# Patient Record
Sex: Male | Born: 1980 | Race: Asian | Hispanic: No | Marital: Married | Smoking: Never smoker
Health system: Southern US, Community
[De-identification: ages and names within clinical notes are randomized; demographics above are authoritative.]

## PROBLEM LIST (undated history)

## (undated) DIAGNOSIS — Z789 Other specified health status: Secondary | ICD-10-CM

---

## 2013-07-28 ENCOUNTER — Emergency Department: Payer: Self-pay | Admitting: Emergency Medicine

## 2014-10-21 ENCOUNTER — Emergency Department
Admission: EM | Admit: 2014-10-21 | Discharge: 2014-10-21 | Disposition: A | Discharge status: Home / Self Care | Payer: 59 | Attending: Emergency Medicine | Admitting: Emergency Medicine

## 2014-10-21 ENCOUNTER — Encounter: Payer: Self-pay | Admitting: *Deleted

## 2014-10-21 DIAGNOSIS — Y998 Other external cause status: Secondary | ICD-10-CM | POA: Insufficient documentation

## 2014-10-21 DIAGNOSIS — S30861A Insect bite (nonvenomous) of abdominal wall, initial encounter: Secondary | ICD-10-CM | POA: Diagnosis not present

## 2014-10-21 DIAGNOSIS — Y9389 Activity, other specified: Secondary | ICD-10-CM | POA: Insufficient documentation

## 2014-10-21 DIAGNOSIS — W57XXXA Bitten or stung by nonvenomous insect and other nonvenomous arthropods, initial encounter: Secondary | ICD-10-CM | POA: Diagnosis not present

## 2014-10-21 DIAGNOSIS — Y9289 Other specified places as the place of occurrence of the external cause: Secondary | ICD-10-CM | POA: Insufficient documentation

## 2014-10-21 DIAGNOSIS — R21 Rash and other nonspecific skin eruption: Secondary | ICD-10-CM | POA: Diagnosis present

## 2014-10-21 MED ORDER — DOXYCYCLINE HYCLATE 100 MG PO TABS
100.0000 mg | ORAL_TABLET | Freq: Once | ORAL | Status: AC
  Administered 2014-10-21: 100 mg via ORAL
  Filled 2014-10-21: qty 1

## 2014-10-21 MED ORDER — DOXYCYCLINE MONOHYDRATE 100 MG PO TABS: 100.0000 mg | ORAL_TABLET | Freq: Two times a day (BID) | ORAL | Status: DC

## 2014-10-21 NOTE — ED Notes (Signed)
Pt discharged home after verbalizing understanding of discharge instructions; nad noted. 

## 2014-10-21 NOTE — ED Notes (Signed)
Pt noticed a rash on his upper right abdomen this afternoon, c/o itching in the area.

## 2014-10-21 NOTE — Discharge Instructions (Signed)
Tick Bite Information Ticks are insects that attach themselves to the skin and draw blood for food. There are various types of ticks. Common types include wood ticks and deer ticks. Most ticks live in shrubs and grassy areas. Ticks can climb onto your body when you make contact with leaves or grass where the tick is waiting. The most common places on the body for ticks to attach themselves are the scalp, neck, armpits, waist, and groin. Most tick bites are harmless, but sometimes ticks carry germs that cause diseases. These germs can be spread to a person during the tick's feeding process. The chance of a disease spreading through a tick bite depends on:   The type of tick.  Time of year.   How long the tick is attached.   Geographic location.  HOW CAN YOU PREVENT TICK BITES? Take these steps to help prevent tick bites when you are outdoors:  Wear protective clothing. Long sleeves and long pants are best.   Wear white clothes so you can see ticks more easily.  Tuck your pant legs into your socks.   If walking on a trail, stay in the middle of the trail to avoid brushing against bushes.  Avoid walking through areas with long grass.  Put insect repellent on all exposed skin and along boot tops, pant legs, and sleeve cuffs.   Check clothing, hair, and skin repeatedly and before going inside.   Brush off any ticks that are not attached.  Take a shower or bath as soon as possible after being outdoors.  WHAT IS THE PROPER WAY TO REMOVE A TICK? Ticks should be removed as soon as possible to help prevent diseases caused by tick bites. 1. If latex gloves are available, put them on before trying to remove a tick.  2. Using fine-point tweezers, grasp the tick as close to the skin as possible. You may also use curved forceps or a tick removal tool. Grasp the tick as close to its head as possible. Avoid grasping the tick on its body. 3. Pull gently with steady upward pressure until  the tick lets go. Do not twist the tick or jerk it suddenly. This may break off the tick's head or mouth parts. 4. Do not squeeze or crush the tick's body. This could force disease-carrying fluids from the tick into your body.  5. After the tick is removed, wash the bite area and your hands with soap and water or other disinfectant such as alcohol. 6. Apply a small amount of antiseptic cream or ointment to the bite site.  7. Wash and disinfect any instruments that were used.  Do not try to remove a tick by applying a hot match, petroleum jelly, or fingernail polish to the tick. These methods do not work and may increase the chances of disease being spread from the tick bite.  WHEN SHOULD YOU SEEK MEDICAL CARE? Contact your health care provider if you are unable to remove a tick from your skin or if a part of the tick breaks off and is stuck in the skin.  After a tick bite, you need to be aware of signs and symptoms that could be related to diseases spread by ticks. Contact your health care provider if you develop any of the following in the days or weeks after the tick bite:  Unexplained fever.  Rash. A circular rash that appears days or weeks after the tick bite may indicate the possibility of Lyme disease. The rash may resemble  a target with a bull's-eye and may occur at a different part of your body than the tick bite.  Redness and swelling in the area of the tick bite.   Tender, swollen lymph glands.   Diarrhea.   Weight loss.   Cough.   Fatigue.   Muscle, joint, or bone pain.   Abdominal pain.   Headache.   Lethargy or a change in your level of consciousness.  Difficulty walking or moving your legs.   Numbness in the legs.   Paralysis.  Shortness of breath.   Confusion.   Repeated vomiting.  Document Released: 03/23/2000 Document Revised: 01/14/2013 Document Reviewed: 09/03/2012 [facility] Patient Information 2015 [place], [place]. This information is  not intended to replace advice given to you by your health care provider. Make sure you discuss any questions you have with your health care provider.   Take antibiotics as directed and follow up with your physician for any concerns.

## 2014-10-21 NOTE — ED Provider Notes (Signed)
[facility] Emergency Department Provider Note  ____________________________________________  Time seen: Approximately 11:15 PM  I have reviewed the triage vital signs and the nursing notes.   HISTORY  Chief Complaint Rash    HPI Jewell Ryans is a 34 y.o. male who noticed a rash to the right upper abdomen today. Some itching. It has a bull's-eye appearance. He did not see a tick today but remembers seeing one last week. No fevers chills, nausea, headache or myalgias. Otherwise he is doing fine.   History reviewed. No pertinent past medical history.  There are no active problems to display for this patient.   History reviewed. No pertinent past surgical history.  Current Outpatient Rx  Name  Route  Sig  Dispense  Refill  . doxycycline (ADOXA) 100 MG tablet   Oral   Take 1 tablet (100 mg total) by mouth 2 (two) times daily.   20 tablet   1     Allergies Review of patient's allergies indicates no known allergies.  No family history on file.  Social History History  Substance Use Topics  . Smoking status: Never Smoker   . Smokeless tobacco: Not on file  . Alcohol Use: No    Review of Systems Constitutional: No fever/chills Eyes: No visual changes. ENT: No sore throat. Cardiovascular: Denies chest pain. Respiratory: Denies shortness of breath. Gastrointestinal: No abdominal pain.  No nausea, no vomiting.  No diarrhea.  No constipation. Musculoskeletal: Negative for back pain. Skin: see above Neurological: Negative for headaches, focal weakness or numbness.  10-point ROS otherwise negative.  ____________________________________________   PHYSICAL EXAM:  VITAL SIGNS: ED Triage Vitals  Enc Vitals Group     BP 10/21/14 2128 141/109 mmHg     Pulse Rate 10/21/14 2128 62     Resp 10/21/14 2128 18     Temp 10/21/14 2128 97.4 F (36.3 C)     Temp Source 10/21/14 2128 Oral     SpO2 10/21/14 2128 96 %     Weight 10/21/14 2128 215  lb (97.523 kg)     Height 10/21/14 2128  (1.778 m)     Head Cir --      Peak Flow --      Pain Score 10/21/14 2126 5     Pain Loc --      Pain Edu? --      Excl. in GC? --     Constitutional: Alert and oriented. Well appearing and in no acute distress. Eyes: Conjunctivae are normal. PERRL. EOMI. Head: Atraumatic. Nose: No congestion/rhinnorhea. Mouth/Throat: Mucous membranes are moist.  Oropharynx non-erythematous. Neck: supple Cardiovascular: Normal rate, regular rhythm. Grossly normal heart sounds.  Good peripheral circulation. Respiratory: Normal respiratory effort.  No retractions. Lungs CTAB. Skin:  Skin is warm, dry and intact. Bull's-eye rash to the right upper abdomen. Red circumference with central clearing and central punctate lesion. Psychiatric: Mood and affect are normal. Speech and behavior are normal.  ____________________________________________   LABS (all labs ordered are listed, but only abnormal results are displayed)  Labs Reviewed - No data to display ____________________________________________  EKG   ____________________________________________  RADIOLOGY   ____________________________________________   PROCEDURES  Procedure(s) performed: None  Critical Care performed: No  ____________________________________________   INITIAL IMPRESSION / ASSESSMENT AND PLAN / ED COURSE  Pertinent labs & imaging results that were available during my care of the patient were reviewed by me and considered in my medical decision making (see chart for details).  34 year old male with  a poles eye rash to the right upper abdomen and possible tick exposure. Otherwise asymptomatic. He is given doxycycline in the ER and a prescription to cover for Lyme disease. He will follow-up with his physician if he develops any symptoms. I also rechecked his blood pressure in the exam room, and it was 134/84. ____________________________________________   FINAL  CLINICAL IMPRESSION(S) / ED DIAGNOSES  Final diagnoses:  Tick bite      Ignacia BayleyRobert Chastidy Ranker, PA-C 10/21/14 2319  Myrna Blazeravid Matthew Schaevitz, MD 10/21/14 [phone]

## 2015-01-15 ENCOUNTER — Encounter: Payer: Self-pay | Admitting: *Deleted

## 2015-01-15 ENCOUNTER — Emergency Department
Admission: EM | Admit: 2015-01-15 | Discharge: 2015-01-15 | Disposition: A | Discharge status: Home / Self Care | Payer: 59 | Attending: Emergency Medicine | Admitting: Emergency Medicine

## 2015-01-15 DIAGNOSIS — K047 Periapical abscess without sinus: Secondary | ICD-10-CM | POA: Insufficient documentation

## 2015-01-15 DIAGNOSIS — K029 Dental caries, unspecified: Secondary | ICD-10-CM | POA: Insufficient documentation

## 2015-01-15 DIAGNOSIS — Z792 Long term (current) use of antibiotics: Secondary | ICD-10-CM | POA: Diagnosis not present

## 2015-01-15 DIAGNOSIS — K0889 Other specified disorders of teeth and supporting structures: Secondary | ICD-10-CM | POA: Diagnosis present

## 2015-01-15 MED ORDER — LIDOCAINE-EPINEPHRINE 2 %-1:100000 IJ SOLN
1.7000 mL | Freq: Once | INTRAMUSCULAR | Status: AC
  Administered 2015-01-15: 1.7 mL
  Filled 2015-01-15: qty 1.7

## 2015-01-15 MED ORDER — IBUPROFEN 800 MG PO TABS: 800.0000 mg | ORAL_TABLET | Freq: Three times a day (TID) | ORAL | Status: DC | PRN

## 2015-01-15 MED ORDER — TRAMADOL HCL 50 MG PO TABS: 50.0000 mg | ORAL_TABLET | Freq: Four times a day (QID) | ORAL | Status: DC | PRN

## 2015-01-15 MED ORDER — PENICILLIN V POTASSIUM 500 MG PO TABS: 500.0000 mg | ORAL_TABLET | Freq: Four times a day (QID) | ORAL | Status: DC

## 2015-01-15 NOTE — Discharge Instructions (Signed)

## 2015-01-15 NOTE — ED Notes (Signed)
Pt states abscess on left side of cheek inside mouth, states swelling and pain

## 2015-01-15 NOTE — ED Provider Notes (Signed)
CSN: [id number]     Arrival date & time 01/15/15  1825 History   First MD Initiated Contact with Patient 01/15/15 1948     Chief Complaint  Patient presents with  . Abscess  . Dental Pain     (Consider location/radiation/quality/duration/timing/severity/associated sxs/prior Treatment) HPI  34 year old male with dental pain for 7 days. He has a history of fractured tooth in the left upper jaw. Patient states he's had increased pain and swelling over the last 7 days. Pain is 10 out of 10 today. No drainage, warmth, erythema. No fevers. Pain is increased with eating. No relief with ibuprofen.  History reviewed. No pertinent past medical history. History reviewed. No pertinent past surgical history. History reviewed. No pertinent family history. Social History  Substance Use Topics  . Smoking status: Never Smoker   . Smokeless tobacco: None  . Alcohol Use: No    Review of Systems  Constitutional: Negative.  Negative for fever and chills.  HENT: Positive for dental problem and facial swelling. Negative for drooling, mouth sores, trouble swallowing and voice change.   Respiratory: Negative for chest tightness and shortness of breath.   Cardiovascular: Negative for chest pain.  Gastrointestinal: Negative for nausea, vomiting, abdominal pain and diarrhea.  Musculoskeletal: Negative for arthralgias, neck pain and neck stiffness.  Skin: Negative.   Psychiatric/Behavioral: Negative for confusion.  All other systems reviewed and are negative.     Allergies  Review of patient's allergies indicates no known allergies.  Home Medications   Prior to Admission medications   Medication Sig Start Date End Date Taking? Authorizing Provider  doxycycline (ADOXA) 100 MG tablet Take 1 tablet (100 mg total) by mouth 2 (two) times daily. 10/21/14   Ignacia Bayley, PA-C  ibuprofen (ADVIL,MOTRIN) 800 MG tablet Take 1 tablet (800 mg total) by mouth every 8 (eight) hours as needed. 01/15/15   Evon Slack, PA-C  penicillin v potassium (VEETID) 500 MG tablet Take 1 tablet (500 mg total) by mouth 4 (four) times daily. 01/15/15   Evon Slack, PA-C  traMADol (ULTRAM) 50 MG tablet Take 1 tablet (50 mg total) by mouth every 6 (six) hours as needed. 01/15/15   Evon Slack, PA-C   BP 151/100 mmHg  Pulse 77  Temp(Src) 98.5 F (36.9 C) (Oral)  Resp 18  Ht  (1.778 m)  Wt 215 lb (97.523 kg)  BMI 30.85 kg/m2  SpO2 97% Physical Exam  Constitutional: He is oriented to person, place, and time. He appears well-developed and well-nourished. No distress.  HENT:  Head: Normocephalic and atraumatic.  Right Ear: External ear normal.  Left Ear: External ear normal.  Nose: Nose normal.  Mouth/Throat: Uvula is midline and oropharynx is clear and moist. No oral lesions. No trismus in the jaw. Normal dentition. Dental abscesses and dental caries present. No uvula swelling.    Eyes: Conjunctivae and EOM are normal. Pupils are equal, round, and reactive to light.  Neck: Normal range of motion. Neck supple.  Cardiovascular: Normal rate and intact distal pulses.   Pulmonary/Chest: Effort normal. No respiratory distress.  Musculoskeletal: Normal range of motion. He exhibits no edema or tenderness.  Neurological: He is alert and oriented to person, place, and time.  Skin: Skin is warm and dry.  Psychiatric: He has a normal mood and affect. His behavior is normal. Judgment and thought content normal.    ED Course  Procedures (including critical care time) Dental block: 1.3 cc of 1% lidocaine with epinephrine was injected  just above the gumline of tooth #15. She tolerated the procedure well. Significant relief after injection.  Labs Review Labs Reviewed - No data to display  Imaging Review No results found. I have personally reviewed and evaluated these images and lab results as part of my medical decision-making.   EKG Interpretation None      MDM   Final diagnoses:  Dental  abscess  Pain, dental   34 year old male with dental abscess/pain. Dental block given in the emergency department. Patient tolerated this well. Pain significantly improved. Started on penicillin VK, given ibuprofen and tramadol. Follow-up with [facility].    Evon Slack, PA-C 01/15/15 1956  Rockne Menghini, MD 01/15/15 2219

## 2019-06-25 ENCOUNTER — Ambulatory Visit: Payer: Self-pay | Attending: Internal Medicine

## 2019-06-25 DIAGNOSIS — Z20822 Contact with and (suspected) exposure to covid-19: Secondary | ICD-10-CM

## 2019-06-27 LAB — NOVEL CORONAVIRUS, NAA: SARS-CoV-2, NAA: NOT DETECTED

## 2020-01-29 ENCOUNTER — Other Ambulatory Visit: Payer: Self-pay

## 2020-01-29 ENCOUNTER — Emergency Department: Payer: Medicaid Other

## 2020-01-29 ENCOUNTER — Inpatient Hospital Stay
Admission: EM | Admit: 2020-01-29 | Discharge: 2020-02-03 | DRG: 871 | Disposition: A | Discharge status: Home / Self Care | Payer: Medicaid Other | Attending: Internal Medicine | Admitting: Internal Medicine

## 2020-01-29 DIAGNOSIS — U071 COVID-19: Secondary | ICD-10-CM

## 2020-01-29 DIAGNOSIS — Z79899 Other long term (current) drug therapy: Secondary | ICD-10-CM | POA: Diagnosis not present

## 2020-01-29 DIAGNOSIS — J9601 Acute respiratory failure with hypoxia: Secondary | ICD-10-CM | POA: Diagnosis present

## 2020-01-29 DIAGNOSIS — E669 Obesity, unspecified: Secondary | ICD-10-CM | POA: Diagnosis present

## 2020-01-29 DIAGNOSIS — D696 Thrombocytopenia, unspecified: Secondary | ICD-10-CM | POA: Diagnosis present

## 2020-01-29 DIAGNOSIS — Z6830 Body mass index (BMI) 30.0-30.9, adult: Secondary | ICD-10-CM | POA: Diagnosis not present

## 2020-01-29 DIAGNOSIS — I10 Essential (primary) hypertension: Secondary | ICD-10-CM | POA: Diagnosis present

## 2020-01-29 DIAGNOSIS — J1282 Pneumonia due to coronavirus disease 2019: Secondary | ICD-10-CM | POA: Diagnosis present

## 2020-01-29 DIAGNOSIS — A4189 Other specified sepsis: Secondary | ICD-10-CM | POA: Diagnosis present

## 2020-01-29 DIAGNOSIS — A0839 Other viral enteritis: Secondary | ICD-10-CM | POA: Diagnosis present

## 2020-01-29 DIAGNOSIS — R109 Unspecified abdominal pain: Secondary | ICD-10-CM

## 2020-01-29 HISTORY — DX: Other specified health status: Z78.9

## 2020-01-29 LAB — CBC WITH DIFFERENTIAL/PLATELET
Abs Immature Granulocytes: 0.03 10*3/uL (ref 0.00–0.07)
Basophils Absolute: 0 10*3/uL (ref 0.0–0.1)
Basophils Relative: 0 %
Eosinophils Absolute: 0 10*3/uL (ref 0.0–0.5)
Eosinophils Relative: 0 %
HCT: 47.4 % (ref 39.0–52.0)
Hemoglobin: 15.9 g/dL (ref 13.0–17.0)
Immature Granulocytes: 1 %
Lymphocytes Relative: 15 %
Lymphs Abs: 0.9 10*3/uL (ref 0.7–4.0)
MCH: 27.6 pg (ref 26.0–34.0)
MCHC: 33.5 g/dL (ref 30.0–36.0)
MCV: 82.3 fL (ref 80.0–100.0)
Monocytes Absolute: 0.2 10*3/uL (ref 0.1–1.0)
Monocytes Relative: 3 %
Neutro Abs: 4.7 10*3/uL (ref 1.7–7.7)
Neutrophils Relative %: 81 %
Platelets: 142 10*3/uL — ABNORMAL LOW (ref 150–400)
RBC: 5.76 MIL/uL (ref 4.22–5.81)
RDW: 14.2 % (ref 11.5–15.5)
WBC: 5.7 10*3/uL (ref 4.0–10.5)
nRBC: 0 % (ref 0.0–0.2)

## 2020-01-29 LAB — URINALYSIS, COMPLETE (UACMP) WITH MICROSCOPIC
Bilirubin Urine: NEGATIVE
Glucose, UA: NEGATIVE mg/dL
Hgb urine dipstick: NEGATIVE
Ketones, ur: NEGATIVE mg/dL
Leukocytes,Ua: NEGATIVE
Nitrite: NEGATIVE
Protein, ur: 100 mg/dL — AB
Specific Gravity, Urine: 1.035 — ABNORMAL HIGH (ref 1.005–1.030)
Squamous Epithelial / LPF: NONE SEEN (ref 0–5)
pH: 5 (ref 5.0–8.0)

## 2020-01-29 LAB — COMPREHENSIVE METABOLIC PANEL
ALT: 75 U/L — ABNORMAL HIGH (ref 0–44)
AST: 68 U/L — ABNORMAL HIGH (ref 15–41)
Albumin: 4 g/dL (ref 3.5–5.0)
Alkaline Phosphatase: 74 U/L (ref 38–126)
Anion gap: 12 (ref 5–15)
BUN: 14 mg/dL (ref 6–20)
CO2: 24 mmol/L (ref 22–32)
Calcium: 8.7 mg/dL — ABNORMAL LOW (ref 8.9–10.3)
Chloride: 101 mmol/L (ref 98–111)
Creatinine, Ser: 1.22 mg/dL (ref 0.61–1.24)
GFR, Estimated: >60 mL/min (ref >60)
Glucose, Bld: 114 mg/dL — ABNORMAL HIGH (ref 70–99)
Potassium: 4 mmol/L (ref 3.5–5.1)
Sodium: 137 mmol/L (ref 135–145)
Total Bilirubin: 0.7 mg/dL (ref 0.3–1.2)
Total Protein: 7.8 g/dL (ref 6.5–8.1)

## 2020-01-29 LAB — FIBRIN DERIVATIVES D-DIMER (ARMC ONLY): Fibrin derivatives D-dimer (ARMC): 540.16 ng/mL (FEU) — ABNORMAL HIGH (ref 0.00–499.00)

## 2020-01-29 LAB — TROPONIN I (HIGH SENSITIVITY)
Troponin I (High Sensitivity): 14 ng/L (ref <18)
Troponin I (High Sensitivity): 17 ng/L (ref <18)

## 2020-01-29 LAB — PROCALCITONIN: Procalcitonin: 0.15 ng/mL

## 2020-01-29 LAB — LACTIC ACID, PLASMA
Lactic Acid, Venous: 1 mmol/L (ref 0.5–1.9)
Lactic Acid, Venous: 1.6 mmol/L (ref 0.5–1.9)

## 2020-01-29 MED ORDER — SODIUM CHLORIDE 0.9 % IV SOLN: 100.0000 mg | Freq: Every day | INTRAVENOUS | Status: DC

## 2020-01-29 MED ORDER — DEXAMETHASONE SODIUM PHOSPHATE 10 MG/ML IJ SOLN
6.0000 mg | INTRAMUSCULAR | Status: DC
  Administered 2020-01-29: 6 mg via INTRAVENOUS
  Filled 2020-01-29: qty 1

## 2020-01-29 MED ORDER — HYDROCOD POLST-CPM POLST ER 10-8 MG/5ML PO SUER
5.0000 mL | Freq: Two times a day (BID) | ORAL | Status: DC | PRN
  Administered 2020-01-30: 5 mL via ORAL
  Filled 2020-01-29: qty 5

## 2020-01-29 MED ORDER — ONDANSETRON HCL 4 MG PO TABS
4.0000 mg | ORAL_TABLET | Freq: Four times a day (QID) | ORAL | Status: DC | PRN
  Administered 2020-01-30: 14:00:00 4 mg via ORAL
  Filled 2020-01-29: qty 1

## 2020-01-29 MED ORDER — SODIUM CHLORIDE 0.9 % IV BOLUS
1000.0000 mL | Freq: Once | INTRAVENOUS | Status: AC
Start: 2020-01-29 — End: 2020-01-29
  Administered 2020-01-29: 1000 mL via INTRAVENOUS

## 2020-01-29 MED ORDER — ASCORBIC ACID 500 MG PO TABS
500.0000 mg | ORAL_TABLET | Freq: Every day | ORAL | Status: DC
  Administered 2020-01-30 – 2020-02-03 (×5): 500 mg via ORAL
  Filled 2020-01-29 (×5): qty 1

## 2020-01-29 MED ORDER — IPRATROPIUM-ALBUTEROL 20-100 MCG/ACT IN AERS
1.0000 | INHALATION_SPRAY | Freq: Four times a day (QID) | RESPIRATORY_TRACT | Status: DC
  Administered 2020-01-29 – 2020-02-03 (×20): 1 via RESPIRATORY_TRACT
  Filled 2020-01-29 (×2): qty 4

## 2020-01-29 MED ORDER — ENOXAPARIN SODIUM 40 MG/0.4ML ~~LOC~~ SOLN
40.0000 mg | SUBCUTANEOUS | Status: DC
  Administered 2020-01-29 – 2020-01-30 (×2): 40 mg via SUBCUTANEOUS
  Filled 2020-01-29 (×2): qty 0.4

## 2020-01-29 MED ORDER — BARICITINIB 2 MG PO TABS
4.0000 mg | ORAL_TABLET | Freq: Every day | ORAL | Status: DC
  Administered 2020-01-30 – 2020-02-03 (×5): 4 mg via ORAL
  Filled 2020-01-29 (×5): qty 2

## 2020-01-29 MED ORDER — GUAIFENESIN-DM 100-10 MG/5ML PO SYRP
10.0000 mL | ORAL_SOLUTION | ORAL | Status: DC | PRN
  Filled 2020-01-29: qty 10

## 2020-01-29 MED ORDER — ACETAMINOPHEN 500 MG PO TABS
1000.0000 mg | ORAL_TABLET | Freq: Once | ORAL | Status: AC
  Administered 2020-01-29: 1000 mg via ORAL
  Filled 2020-01-29: qty 2

## 2020-01-29 MED ORDER — ONDANSETRON HCL 4 MG/2ML IJ SOLN: 4.0000 mg | Freq: Four times a day (QID) | INTRAMUSCULAR | Status: DC | PRN

## 2020-01-29 MED ORDER — SODIUM CHLORIDE 0.9 % IV SOLN
200.0000 mg | Freq: Once | INTRAVENOUS | Status: AC
  Administered 2020-01-29: 200 mg via INTRAVENOUS
  Filled 2020-01-29: qty 200

## 2020-01-29 MED ORDER — METHYLPREDNISOLONE SODIUM SUCC 125 MG IJ SOLR
100.0000 mg | Freq: Once | INTRAMUSCULAR | Status: AC
  Administered 2020-01-29: 100 mg via INTRAVENOUS
  Filled 2020-01-29: qty 2

## 2020-01-29 MED ORDER — SODIUM CHLORIDE 0.9 % IV SOLN
100.0000 mg | Freq: Every day | INTRAVENOUS | Status: AC
  Administered 2020-01-30 – 2020-02-02 (×4): 100 mg via INTRAVENOUS
  Filled 2020-01-29 (×4): qty 20

## 2020-01-29 MED ORDER — SODIUM CHLORIDE 0.9 % IV SOLN: 200.0000 mg | Freq: Once | INTRAVENOUS | Status: DC

## 2020-01-29 MED ORDER — ZINC SULFATE 220 (50 ZN) MG PO CAPS
220.0000 mg | ORAL_CAPSULE | Freq: Every day | ORAL | Status: DC
  Administered 2020-01-30 – 2020-02-03 (×5): 220 mg via ORAL
  Filled 2020-01-29 (×5): qty 1

## 2020-01-29 MED ORDER — ACETAMINOPHEN 325 MG PO TABS
650.0000 mg | ORAL_TABLET | Freq: Four times a day (QID) | ORAL | Status: DC | PRN
  Administered 2020-01-30 – 2020-02-02 (×4): 650 mg via ORAL
  Filled 2020-01-29 (×4): qty 2

## 2020-01-29 NOTE — H&P (Signed)
History and Physical   Sherril Heyward JOI:[id number] DOB: [birth year] DOA: 01/29/2020  Referring MD/NP/PA: Dr. Shaune Pollack  PCP: [facility]   Outpatient Specialists: None  Patient coming from: Home  Chief Complaint: Fever and fatigue  HPI: Lawrence Wolf is a 39 y.o. male with medical history significant of no significant past medical history who was diagnosed with COVID-19 about 8 days ago.  At that point he was having fever chills cough and nausea vomiting.  He was seen in the clinic and diagnosed with COVID-19.  Patient was discharged home on supportive care.  He continues to have nausea vomiting and diarrhea.  He has lost at least 10 pounds over the.  Of time.  Patient has not gradually developed shortness of breath in the last 24 hours was extreme fatigue.  He was unable to walk.  He was also unable to function.  He has been feeling dizzy lightheaded and not able to keep food down.  He came to the ER where he was seen and evaluated.  Patient described presyncopal symptoms.  At this point he is only mildly hypoxic with movement with oxygen sats 89 to 91% range.  His x-ray showed bilateral infiltrates.  Due to progressive worsening of symptoms and the presyncopal episodes as well as nausea vomiting patient being admitted to the hospital for further evaluation.  He appears to have more GI manifestation with a Covid at this point..  ED Course: Temperature is 102.6 his Wolf pressure 120/75 pulse 101 respirate of 35 oxygen sat 92% room air.  Urinalysis essentially negative.  Fibrin 540.16 procalcitonin of 0.15 lactic acid 1.6 troponin XVII.  Chemistry showed glucose 114 calcium 8.7 AST 68 ALT 75 the rest of the chemistry appears to be within normal.  Chest x-ray showed patchy airspace opacities consistent with COVID-19 infection.  Patient is being admitted with COVID-19 pneumonia symptomatic with bilateral pneumonia.  Review of Systems: As per HPI otherwise 10 point review of systems  negative.    History reviewed. No pertinent past medical history.  History reviewed. No pertinent surgical history.   reports that he has never smoked. He does not have any smokeless tobacco history on file. He reports that he does not drink alcohol and does not use drugs.  No Known Allergies  No family history on file.   Prior to Admission medications   Medication Sig Start Date End Date Taking? Authorizing Provider  cyanocobalamin (,VITAMIN B-12,) 1000 MCG/ML injection Inject 1,000 mcg into the muscle every 30 (thirty) days. 12/21/19  Yes [provider]    Physical Exam: Vitals:   01/29/20 1814 01/29/20 2100  BP: 122/75 113/69  Pulse: (!) 101 88  Resp: (!) 26 (!) 34  Temp: (!) 102.5 F (39.2 C) (!) 102.6 F (39.2 C)  TempSrc: Oral Oral  SpO2: 95% 94%  Weight: 96.2 kg   Height: 5\' 10"  (1.778 m)       Constitutional: Acutely ill looking, no distress Vitals:   01/29/20 1814 01/29/20 2100  BP: 122/75 113/69  Pulse: (!) 101 88  Resp: (!) 26 (!) 34  Temp: (!) 102.5 F (39.2 C) (!) 102.6 F (39.2 C)  TempSrc: Oral Oral  SpO2: 95% 94%  Weight: 96.2 kg   Height: 5\' 10"  (1.778 m)    Eyes: PERRL, lids and conjunctivae normal ENMT: Mucous membranes are dry. Posterior pharynx clear of any exudate or lesions.Normal dentition.  Neck: normal, supple, no masses, no thyromegaly Respiratory: Decreased air entry bilaterally, coarse breath sound,  diffuse rhonchi bilaterally, normal respiratory effort. No accessory muscle use.  Cardiovascular: Sinus tachycardia, no murmurs / rubs / gallops. No extremity edema. 2+ pedal pulses. No carotid bruits.  Abdomen: no tenderness, no masses palpated. No hepatosplenomegaly. Bowel sounds positive.  Musculoskeletal: no clubbing / cyanosis. No joint deformity upper and lower extremities. Good ROM, no contractures. Normal muscle tone.  Skin: no rashes, lesions, ulcers. No induration Neurologic: CN 2-12 grossly intact. Sensation intact,  DTR normal. Strength 5/5 in all 4.  Psychiatric: Normal judgment and insight. Alert and oriented x 3. Normal mood.     Labs on Admission: I have personally reviewed following labs and imaging studies  CBC: Recent Labs  Lab 01/29/20 1825  WBC 5.7  NEUTROABS 4.7  HGB 15.9  HCT 47.4  MCV 82.3  PLT 142*   Basic Metabolic Panel: Recent Labs  Lab 01/29/20 1825  NA 137  K 4.0  CL 101  CO2 24  GLUCOSE 114*  BUN 14  CREATININE 1.22  CALCIUM 8.7*   GFR: Estimated Creatinine Clearance: 96.5 mL/min (by C-G formula based on SCr of 1.22 mg/dL). Liver Function Tests: Recent Labs  Lab 01/29/20 1825  AST 68*  ALT 75*  ALKPHOS 74  BILITOT 0.7  PROT 7.8  ALBUMIN 4.0   No results for input(s): LIPASE, AMYLASE in the last 168 hours. No results for input(s): AMMONIA in the last 168 hours. Coagulation Profile: No results for input(s): INR, PROTIME in the last 168 hours. Cardiac Enzymes: No results for input(s): CKTOTAL, CKMB, CKMBINDEX, TROPONINI in the last 168 hours. BNP (last 3 results) No results for input(s): PROBNP in the last 8760 hours. HbA1C: No results for input(s): HGBA1C in the last 72 hours. CBG: No results for input(s): GLUCAP in the last 168 hours. Lipid Profile: No results for input(s): CHOL, HDL, LDLCALC, TRIG, CHOLHDL, LDLDIRECT in the last 72 hours. Thyroid Function Tests: No results for input(s): TSH, T4TOTAL, FREET4, T3FREE, THYROIDAB in the last 72 hours. Anemia Panel: No results for input(s): VITAMINB12, FOLATE, FERRITIN, TIBC, IRON, RETICCTPCT in the last 72 hours. Urine analysis:    Component Value Date/Time   COLORURINE AMBER (A) 01/29/2020 2057   APPEARANCEUR HAZY (A) 01/29/2020 2057   LABSPEC 1.035 (H) 01/29/2020 2057   PHURINE 5.0 01/29/2020 2057   GLUCOSEU NEGATIVE 01/29/2020 2057   HGBUR NEGATIVE 01/29/2020 2057   BILIRUBINUR NEGATIVE 01/29/2020 2057   KETONESUR NEGATIVE 01/29/2020 2057   PROTEINUR 100 (A) 01/29/2020 2057   NITRITE  NEGATIVE 01/29/2020 2057   LEUKOCYTESUR NEGATIVE 01/29/2020 2057   Sepsis Labs: @LABRCNTIP (procalcitonin:4,lacticidven:4) )No results found for this or any previous visit (from the past 240 hour(s)).   Radiological Exams on Admission: DG Chest 2 View  Result Date: 01/29/2020 CLINICAL DATA:  Shortness of breath and COVID-19 positivity EXAM: CHEST - 2 VIEW COMPARISON:  None. FINDINGS: Cardiac shadow is within normal limits. Scattered airspace opacities are noted throughout both lungs consistent with the given clinical history. No sizable effusion or pneumothorax is noted. No bony abnormality is seen. IMPRESSION: Patchy airspace opacities consistent with the given clinical history. Electronically Signed   By: 01/31/2020 M.D.   On: 01/29/2020 18:53    EKG: Independently reviewed.  Sinus tachycardia  Assessment/Plan Active Problems:   Sepsis due to COVID-19 [facility])     #1 sepsis: Secondary to COVID-19 infection.  Patient meets sepsis criteria with temperature pulse rate as well as respiratory rate.  We will admit the patient and treat him with sepsis protocol.  Mild  hydration but try to keep him close to dryness due to Covid infection.  #2 COVID-19 pneumonia: Patient will be initiated on remdesivir, dexamethasone, oxygen, multivitamins, follow daily CRP on labs.  Also inhalers as needed.  Other supportive care.  #3 nausea vomiting diarrhea: Suspected due to COVID-19 infection.  Continue treatment as above.   DVT prophylaxis: Lovenox Code Status: Full code Family Communication: No family at bedside Disposition Plan: Home Consults called: None Admission status: Inpatient  Severity of Illness: The appropriate patient status for this patient is INPATIENT. Inpatient status is judged to be reasonable and necessary in order to provide the required intensity of service to ensure the patient's safety. The patient's presenting symptoms, physical exam findings, and initial radiographic and  laboratory data in the context of their chronic comorbidities is felt to place them at high risk for further clinical deterioration. Furthermore, it is not anticipated that the patient will be medically stable for discharge from the hospital within 2 midnights of admission. The following factors support the patient status of inpatient.   " The patient's presenting symptoms include nausea vomiting diarrhea and fatigue. " The worrisome physical exam findings include dry mucous membranes. " The initial radiographic and laboratory data are worrisome because of evidence of bilateral pneumonia. " The chronic co-morbidities include none.   * I certify that at the point of admission it is my clinical judgment that the patient will require inpatient hospital care spanning beyond 2 midnights from the point of admission due to high intensity of service, high risk for further deterioration and high frequency of surveillance required.Lawrence Blood MD [facility] Pager [phone]  If 7PM-7AM, please contact night-coverage [email or web address] Password [email or web address]  01/29/2020, 10:18 PM

## 2020-01-29 NOTE — ED Triage Notes (Signed)
Pt to ED POV tested COVID + last Friday, states he is not feeling well.  Reports intermittent CP and SHOB Diarrhea, N/V that started 2 days ago.  Febrile on arrival 102.5, states he took ibuprofen and tylenol before coming  Pt speaking in complete sentences, NAD noted

## 2020-01-29 NOTE — ED Provider Notes (Signed)
[facility] Emergency Department Provider Note  ____________________________________________   First MD Initiated Contact with Patient 01/29/20 2035     (approximate)  I have reviewed the triage vital signs and the nursing notes.   HISTORY  Chief Complaint COVID+    HPI Lawrence Wolf is a 39 y.o. male  Here with covid-19, fatigue. Pt states that approx 8 days ago, he began to develop fever, chills, cough, n/v. He went to a clinic and was diagnosed with COVID-19. Since then, he's had persistent n/v, diarrhea and >10 lb weight loss. Over the past 24 hours, he's had acute worsening in SOB along with extreme fatigue. He has had difficulty walking, eating, or moving for past 24 hours. He feels extremely lightheaded and near syncopal with standing. No h/o lung disease. No smoking. No other complaints. Sx are all worse with movement. He has been taking APAP, ibuprofen w/o significant relief.        History reviewed. No pertinent past medical history.  There are no problems to display for this patient.   History reviewed. No pertinent surgical history.  Prior to Admission medications   Medication Sig Start Date End Date Taking? Authorizing Provider  cyanocobalamin (,VITAMIN B-12,) 1000 MCG/ML injection Inject 1,000 mcg into the muscle every 30 (thirty) days. 12/21/19  Yes [provider]    Allergies Patient has no known allergies.  No family history on file.  Social History Social History   Tobacco Use  . Smoking status: Never Smoker  Substance Use Topics  . Alcohol use: No  . Drug use: No    Review of Systems  Review of Systems  Constitutional: Positive for chills and fatigue. Negative for fever.  HENT: Positive for sore throat.   Respiratory: Positive for cough and shortness of breath.   Cardiovascular: Positive for palpitations. Negative for chest pain.  Gastrointestinal: Positive for diarrhea, nausea and vomiting. Negative for  abdominal pain.  Genitourinary: Negative for flank pain.  Musculoskeletal: Negative for neck pain.  Skin: Negative for rash and wound.  Allergic/Immunologic: Negative for immunocompromised state.  Neurological: Positive for weakness. Negative for numbness.  Hematological: Does not bruise/bleed easily.  All other systems reviewed and are negative.    ____________________________________________  PHYSICAL EXAM:      VITAL SIGNS: ED Triage Vitals [01/29/20 1814]  Enc Vitals Group     BP 122/75     Pulse Rate (!) 101     Resp (!) 26     Temp (!) 102.5 F (39.2 C)     Temp Source Oral     SpO2 95 %     Weight 212 lb (96.2 kg)     Height 5\' 10"  (1.778 m)     Head Circumference      Peak Flow      Pain Score 7     Pain Loc      Pain Edu?      Excl. in GC?      Physical Exam Vitals and nursing note reviewed.  Constitutional:      General: He is not in acute distress.    Appearance: He is well-developed.  HENT:     Head: Normocephalic and atraumatic.     Mouth/Throat:     Mouth: Mucous membranes are dry.  Eyes:     Conjunctiva/sclera: Conjunctivae normal.  Cardiovascular:     Rate and Rhythm: Regular rhythm. Tachycardia present.     Heart sounds: Normal heart sounds. No murmur heard.  No  friction rub.  Pulmonary:     Effort: Tachypnea and respiratory distress present.     Breath sounds: Examination of the right-middle field reveals rales. Examination of the left-middle field reveals rales. Examination of the right-lower field reveals rales. Examination of the left-lower field reveals rales. Decreased breath sounds and rales present.  Abdominal:     General: There is no distension.     Palpations: Abdomen is soft.     Tenderness: There is no abdominal tenderness.  Musculoskeletal:     Cervical back: Neck supple.  Skin:    General: Skin is warm.     Capillary Refill: Capillary refill takes less than 2 seconds.  Neurological:     Mental Status: He is alert and  oriented to person, place, and time.     Motor: No abnormal muscle tone.       ____________________________________________   LABS (all labs ordered are listed, but only abnormal results are displayed)  Labs Reviewed  COMPREHENSIVE METABOLIC PANEL - Abnormal; Notable for the following components:      Result Value   Glucose, Bld 114 (*)    Calcium 8.7 (*)    AST 68 (*)    ALT 75 (*)    All other components within normal limits  CBC WITH DIFFERENTIAL/PLATELET - Abnormal; Notable for the following components:   Platelets 142 (*)    All other components within normal limits  URINALYSIS, COMPLETE (UACMP) WITH MICROSCOPIC - Abnormal; Notable for the following components:   Color, Urine AMBER (*)    APPearance HAZY (*)    Specific Gravity, Urine 1.035 (*)    Protein, ur 100 (*)    Bacteria, UA RARE (*)    All other components within normal limits  CULTURE, BLOOD (SINGLE)  LACTIC ACID, PLASMA  LACTIC ACID, PLASMA  FIBRIN DERIVATIVES D-DIMER ([facility] ONLY)  C-REACTIVE PROTEIN  PROCALCITONIN  TROPONIN I (HIGH SENSITIVITY)  TROPONIN I (HIGH SENSITIVITY)    ____________________________________________  EKG: Normal sinus rhythm, VR 99. PR 164, QRS 70, QTc 415. Non specific TWC. No acute ST elevations or depressions. ________________________________________  RADIOLOGY All imaging, including plain films, CT scans, and ultrasounds, independently reviewed by me, and interpretations confirmed via formal radiology reads.  ED MD interpretation:   CXR: Multifocal PNA c/w COVID-19  Official radiology report(s): DG Chest 2 View  Result Date: 01/29/2020 CLINICAL DATA:  Shortness of breath and COVID-19 positivity EXAM: CHEST - 2 VIEW COMPARISON:  None. FINDINGS: Cardiac shadow is within normal limits. Scattered airspace opacities are noted throughout both lungs consistent with the given clinical history. No sizable effusion or pneumothorax is noted. No bony abnormality is seen. IMPRESSION:  Patchy airspace opacities consistent with the given clinical history. Electronically Signed   By: Alcide Clever M.D.   On: 01/29/2020 18:53    ____________________________________________  PROCEDURES   Procedure(s) performed (including Critical Care):  .Critical Care Performed by: Shaune Pollack, MD Authorized by: Shaune Pollack, MD   Critical care provider statement:    Critical care time (minutes):  35   Critical care time was exclusive of:  Separately billable procedures and treating other patients and teaching time   Critical care was necessary to treat or prevent imminent or life-threatening deterioration of the following conditions:  Cardiac failure, circulatory failure and respiratory failure   Critical care was time spent personally by me on the following activities:  Development of treatment plan with patient or surrogate, discussions with consultants, evaluation of patient's response to treatment, examination of patient,  obtaining history from patient or surrogate, ordering and performing treatments and interventions, ordering and review of laboratory studies, ordering and review of radiographic studies, pulse oximetry, re-evaluation of patient's condition and review of old charts   I assumed direction of critical care for this patient from another provider in my specialty: no      ____________________________________________  INITIAL IMPRESSION / MDM / ASSESSMENT AND PLAN / ED COURSE  As part of my medical decision making, I reviewed the following data within the electronic MEDICAL RECORD NUMBER Nursing notes reviewed and incorporated, Old chart reviewed, Notes from prior ED visits, and [place] Controlled Substance Database       *Akiel Fennell was evaluated in Emergency Department on 01/29/2020 for the symptoms described in the history of present illness. He was evaluated in the context of the global COVID-19 pandemic, which necessitated consideration that the patient might be at  risk for infection with the SARS-CoV-2 virus that causes COVID-19. Institutional protocols and algorithms that pertain to the evaluation of patients at risk for COVID-19 are in a state of rapid change based on information released by regulatory bodies including the CDC and federal and state organizations. These policies and algorithms were followed during the patient's care in the ED.  Some ED evaluations and interventions may be delayed as a result of limited staffing during the pandemic.*     Medical Decision Making:  39 yo M here with COVID-19 and worsening SOB, fatigue, n/v with .10 lb weight loss. On arrival, pt satting 90-91 % on RA with RR over 30s with even repositioning in ped. Unable to ambulate without profound dyspnea. Labs overall reassuring, with CBC without leukocytosis, anemia. CMP shows mild transaminitis, likely viral. LA normal. CXR reviewed by me and shows multifocal PNA. Pt given fluids, steroids, remdesevir and will admit. On cardiac monitoring, no significant arrhythmia or ectopy noted.  ____________________________________________  FINAL CLINICAL IMPRESSION(S) / ED DIAGNOSES  Final diagnoses:  Pneumonia due to COVID-19 virus     MEDICATIONS GIVEN DURING THIS VISIT:  Medications  sodium chloride 0.9 % bolus 1,000 mL (1,000 mLs Intravenous New Bag/Given 01/29/20 2130)  methylPREDNISolone sodium succinate (SOLU-MEDROL) 125 mg/2 mL injection 100 mg (100 mg Intravenous Given 01/29/20 2131)  acetaminophen (TYLENOL) tablet 1,000 mg (1,000 mg Oral Given 01/29/20 2131)     ED Discharge Orders    None       Note:  This document was prepared using Dragon voice recognition software and may include unintentional dictation errors.   Shaune Pollack, MD 01/29/20 2200

## 2020-01-29 NOTE — Progress Notes (Signed)
Remdesivir - Pharmacy Brief Note   O:  CXR: multifocal pneumonia SpO2: 90% on RA   A/P:  Remdesivir 200 mg IVPB once followed by 100 mg IVPB daily x 4 days.   Clovia Cuff, PharmD, BCPS 01/29/2020 10:05 PM

## 2020-01-29 NOTE — ED Notes (Signed)
RN sunquest labels not printing at this time. RN collected 1 set of blood cultures, 2 lavender, 2 lt green, 1 blue, and 1 SST from patient and sent to lab for processing with patient label stickers. All labels sent with date, time and initial inscribed on them.

## 2020-01-30 ENCOUNTER — Encounter: Payer: Self-pay | Admitting: Internal Medicine

## 2020-01-30 DIAGNOSIS — A4189 Other specified sepsis: Secondary | ICD-10-CM | POA: Diagnosis not present

## 2020-01-30 DIAGNOSIS — U071 COVID-19: Secondary | ICD-10-CM | POA: Diagnosis not present

## 2020-01-30 LAB — COMPREHENSIVE METABOLIC PANEL
ALT: 75 U/L — ABNORMAL HIGH (ref 0–44)
AST: 72 U/L — ABNORMAL HIGH (ref 15–41)
Albumin: 3.6 g/dL (ref 3.5–5.0)
Alkaline Phosphatase: 76 U/L (ref 38–126)
Anion gap: 9 (ref 5–15)
BUN: 13 mg/dL (ref 6–20)
CO2: 26 mmol/L (ref 22–32)
Calcium: 8.4 mg/dL — ABNORMAL LOW (ref 8.9–10.3)
Chloride: 103 mmol/L (ref 98–111)
Creatinine, Ser: 1.28 mg/dL — ABNORMAL HIGH (ref 0.61–1.24)
GFR, Estimated: >60 mL/min (ref >60)
Glucose, Bld: 181 mg/dL — ABNORMAL HIGH (ref 70–99)
Potassium: 4.6 mmol/L (ref 3.5–5.1)
Sodium: 138 mmol/L (ref 135–145)
Total Bilirubin: 0.6 mg/dL (ref 0.3–1.2)
Total Protein: 7.4 g/dL (ref 6.5–8.1)

## 2020-01-30 LAB — CBC WITH DIFFERENTIAL/PLATELET
Abs Immature Granulocytes: 0.02 10*3/uL (ref 0.00–0.07)
Basophils Absolute: 0 10*3/uL (ref 0.0–0.1)
Basophils Relative: 0 %
Eosinophils Absolute: 0 10*3/uL (ref 0.0–0.5)
Eosinophils Relative: 0 %
HCT: 45.2 % (ref 39.0–52.0)
Hemoglobin: 15.2 g/dL (ref 13.0–17.0)
Immature Granulocytes: 0 %
Lymphocytes Relative: 6 %
Lymphs Abs: 0.4 10*3/uL — ABNORMAL LOW (ref 0.7–4.0)
MCH: 28.2 pg (ref 26.0–34.0)
MCHC: 33.6 g/dL (ref 30.0–36.0)
MCV: 83.9 fL (ref 80.0–100.0)
Monocytes Absolute: 0.1 10*3/uL (ref 0.1–1.0)
Monocytes Relative: 1 %
Neutro Abs: 5.3 10*3/uL (ref 1.7–7.7)
Neutrophils Relative %: 93 %
Platelets: 144 10*3/uL — ABNORMAL LOW (ref 150–400)
RBC: 5.39 MIL/uL (ref 4.22–5.81)
RDW: 14.3 % (ref 11.5–15.5)
WBC: 5.8 10*3/uL (ref 4.0–10.5)
nRBC: 0 % (ref 0.0–0.2)

## 2020-01-30 LAB — C-REACTIVE PROTEIN
CRP: 6.7 mg/dL — ABNORMAL HIGH (ref <1.0)
CRP: 7.8 mg/dL — ABNORMAL HIGH (ref <1.0)

## 2020-01-30 LAB — FERRITIN: Ferritin: 766 ng/mL — ABNORMAL HIGH (ref 24–336)

## 2020-01-30 LAB — FIBRIN DERIVATIVES D-DIMER (ARMC ONLY): Fibrin derivatives D-dimer (ARMC): 573.35 ng/mL (FEU) — ABNORMAL HIGH (ref 0.00–499.00)

## 2020-01-30 LAB — HIV ANTIBODY (ROUTINE TESTING W REFLEX): HIV Screen 4th Generation wRfx: NONREACTIVE

## 2020-01-30 LAB — PHOSPHORUS: Phosphorus: 4.3 mg/dL (ref 2.5–4.6)

## 2020-01-30 LAB — MAGNESIUM: Magnesium: 2.4 mg/dL (ref 1.7–2.4)

## 2020-01-30 MED ORDER — SIMETHICONE 80 MG PO CHEW
80.0000 mg | CHEWABLE_TABLET | Freq: Four times a day (QID) | ORAL | Status: DC | PRN
  Filled 2020-01-30: qty 1

## 2020-01-30 MED ORDER — PANTOPRAZOLE SODIUM 40 MG PO TBEC
40.0000 mg | DELAYED_RELEASE_TABLET | Freq: Every day | ORAL | Status: DC
  Administered 2020-01-30 – 2020-02-03 (×5): 40 mg via ORAL
  Filled 2020-01-30 (×5): qty 1

## 2020-01-30 MED ORDER — POLYETHYLENE GLYCOL 3350 17 G PO PACK
17.0000 g | PACK | Freq: Every day | ORAL | Status: DC
  Administered 2020-01-31 – 2020-02-02 (×3): 17 g via ORAL
  Filled 2020-01-30 (×4): qty 1

## 2020-01-30 MED ORDER — VITAMIN D 25 MCG (1000 UNIT) PO TABS
1000.0000 [IU] | ORAL_TABLET | Freq: Every day | ORAL | Status: DC
  Administered 2020-01-31 – 2020-02-03 (×4): 1000 [IU] via ORAL
  Filled 2020-01-30 (×4): qty 1

## 2020-01-30 MED ORDER — SODIUM CHLORIDE 0.9 % IV SOLN: INTRAVENOUS | Status: DC

## 2020-01-30 NOTE — Plan of Care (Signed)
  Problem: Education: Goal: Knowledge of risk factors and measures for prevention of condition will improve Outcome: Progressing   Problem: Coping: Goal: Psychosocial and spiritual needs will be supported Outcome: Progressing   Problem: Respiratory: Goal: Will maintain a patent airway Outcome: Progressing Goal: Complications related to the disease process, condition or treatment will be avoided or minimized Outcome: Progressing   

## 2020-01-30 NOTE — Progress Notes (Signed)
°   01/30/20 1244  Assess: MEWS Score  Temp (!) 100.5 F (38.1 C)  BP (!) 144/95  Pulse Rate 88  Resp (!) 32  SpO2 94 %  O2 Device Room Air  Assess: MEWS Score  MEWS Temp 1  MEWS Systolic 0  MEWS Pulse 0  MEWS RR 2  MEWS LOC 0  MEWS Score 3  MEWS Score Color Yellow  Assess: if the MEWS score is Yellow or Red  Were vital signs taken at a resting state? Yes  Focused Assessment No change from prior assessment  Early Detection of Sepsis Score *See Row Information* Low  MEWS guidelines implemented *See Row Information* Yes  Treat  MEWS Interventions Administered prn meds/treatments;Administered scheduled meds/treatments  Pain Scale 0-10  Pain Score 8  Pain Intervention(s) Repositioned;Medication (See eMAR)  Take Vital Signs  Increase Vital Sign Frequency  Yellow: Q 2hr X 2 then Q 4hr X 2, if remains yellow, continue Q 4hrs  Escalate  MEWS: Escalate Yellow: discuss with charge nurse/RN and consider discussing with provider and RRT  Notify: Charge Nurse/RN  Name of Charge Nurse/RN Notified Robyn  Date Charge Nurse/RN Notified 01/30/20  Time Charge Nurse/RN Notified 1246  Document  Patient Outcome Other (Comment) (will continue to monitor)  Progress note created (see row info) Yes

## 2020-01-30 NOTE — ED Notes (Signed)
Breakfast tray given at this time.  

## 2020-01-30 NOTE — Progress Notes (Signed)
PROGRESS NOTE    Lawrence Wolf   JSH:[id number]  DOB: 07-04-1980  [facility]P: [facility]    DOA: 01/29/2020 LOS: 1   Brief Narrative   Lawrence Wolf is a 39 y.o. male with medical history of hypertension who was diagnosed with COVID-19 about 8 days prior to presenting to the ED on 01/29/20.   He reported initially symptoms of fever/chills, nausea and vomiting, and since developed shortness of breath, profound fatigue, diarrhea and presyncopal episodes of dizziness / lightheadedness, unable to even walk.    Evaluation in the ED - febrile at 102.6 F, normotensive, HR 101, RR 35, spO2 92% on room air.  Labs notable for mildly elevated LFT's AST 68, ALT 75, normal lactic acid, procal 0.15, mild thrombocytopenia Plts 142k.  Chest xray showed bilateral patchy infiltrates consistent with Covid-19 pneumonia.  Initial CRP 6.7.  Admitted for management of Covid-19 infection with both GI symptoms and multifocal pneumonia.      Assessment & Plan   Active Problems:   Sepsis due to COVID-19 [facility])   Sepsis secondary to Covid-19 Infection with Pneumonia and Gastroenteritis - POA with positive Covid-19 test outpatient, presented with above symptoms, both GI and respiratory.  Not hypoxic on presentation.  Chest xray showing pneumonia.  Met sepsis criteria with tachycardia, tachypnea and known source Covid-19 infection. --Continue remdesivir (10/22 >>) --Stop dexamethasone since not hypoxic.  Resume steroid if hypoxia develops. --Continue baricitinib (10/23 >>) --Resume IV fluids given presyncopal symptoms --Antitussives PRN --Combivent q6h --Vitamin C, D3, zinc --follow blood culture --prone or lateral decubitus positioning as tolerated --monitor spO2, use oxygen as needed to keep spO2 at or above 90% --follow inflammatory markers, CMP, CBC   Abdominal pain - unclear cause, likely related to Covid infection.   --Trial of protonix and PRN simethicone for now. --Ultrasound pending,  but will consider CT abdomen/pelvis for further evaluation.   Thrombocytopenia - present on admission with Plts 142k >> 144k.  Likely due to sepsis vs liver disease (has mild LFT elevation as well).   Elevated LFT's - present on admission, AST 68, ALT 75. --Follow CMP's, monitor closely with remdesivir and baricitinib, okay to continue for now --RUQ ultrasound tomorrow --Check PT/INR with AM labs   DVT prophylaxis: enoxaparin (LOVENOX) injection 40 mg Start: 01/29/20 2230   Diet:  Diet Orders (From admission, onward)    Start     Ordered   01/29/20 2219  Diet regular Room service appropriate? Yes; Fluid consistency: Thin  Diet effective now       Question Answer Comment  Room service appropriate? Yes   Fluid consistency: Thin      01/29/20 2219            Code Status: Full Code    Subjective 01/30/20    Pt seen at bedside just after arriving on unit from ED.  Reports being extremely weak and tired.  Mild abdominal pain across mid-abdomen.   Disposition Plan & Communication   Status is: Inpatient  Remains inpatient appropriate because:IV treatments appropriate due to intensity of illness or inability to take PO   Dispo: The patient is from: Home              Anticipated d/c is to: Home              Anticipated d/c date is: 2-3 days              Patient currently is not medically stable to d/c.  Family Communication: spoke with patient's wife, Dwaine Deter, by phone this evening 10/23.    Consults, Procedures, Significant Events   Consultants:   None  Procedures:   None  Antimicrobials:  Anti-infectives (From admission, onward)   Start     Dose/Rate Route Frequency Ordered Stop   01/30/20 1000  remdesivir 100 mg in sodium chloride 0.9 % 100 mL IVPB       "Followed by" Linked Group Details   100 mg 200 mL/hr over 30 Minutes Intravenous Daily 01/29/20 2205 02/03/20 0959   01/30/20 1000  remdesivir 100 mg in sodium chloride 0.9 % 100 mL IVPB  Status:   Discontinued       "Followed by" Linked Group Details   100 mg 200 mL/hr over 30 Minutes Intravenous Daily 01/29/20 2219 01/29/20 2226   01/29/20 2230  remdesivir 200 mg in sodium chloride 0.9% 250 mL IVPB  Status:  Discontinued       "Followed by" Linked Group Details   200 mg 580 mL/hr over 30 Minutes Intravenous Once 01/29/20 2219 01/29/20 2226   01/29/20 2215  remdesivir 200 mg in sodium chloride 0.9% 250 mL IVPB       "Followed by" Linked Group Details   200 mg 580 mL/hr over 30 Minutes Intravenous Once 01/29/20 2205 01/30/20 0136         Objective   Vitals:   01/30/20 0545 01/30/20 0600 01/30/20 0630 01/30/20 0700  BP:  117/67 121/74 107/70  Pulse:  73 74 68  Resp:  (!) 26 (!) 23 (!) 27  Temp:      TempSrc:      SpO2: 95%  91% 92%  Weight:      Height:        Intake/Output Summary (Last 24 hours) at 01/30/2020 0735 Last data filed at 01/29/2020 2310 Gross per 24 hour  Intake 1000 ml  Output --  Net 1000 ml   Filed Weights   01/29/20 1814  Weight: 96.2 kg    Physical Exam:  General exam: awake, alert, no acute distress Respiratory system: bibasilar crackles, no wheezes or rhonchi, normal respiratory effort, on room air. Cardiovascular system: normal S1/S2, RRR, no JVD, murmurs, rubs, gallops, no pedal edema.   Gastrointestinal system: soft, mildly tender, not distended, hypoactive bowel sounds Central nervous system: A&O x4. no gross focal neurologic deficits, normal speech Extremities: moves all, no edema, normal tone Psychiatry: normal mood, congruent affect, judgement and insight appear normal  Labs   Data Reviewed: I have personally reviewed following labs and imaging studies  CBC: Recent Labs  Lab 01/29/20 1825 01/30/20 0530  WBC 5.7 5.8  NEUTROABS 4.7 5.3  HGB 15.9 15.2  HCT 47.4 45.2  MCV 82.3 83.9  PLT 142* 782*   Basic Metabolic Panel: Recent Labs  Lab 01/29/20 1825 01/30/20 0530  NA 137 138  K 4.0 4.6  CL 101 103  CO2 24 26   GLUCOSE 114* 181*  BUN 14 13  CREATININE 1.22 1.28*  CALCIUM 8.7* 8.4*  MG  --  2.4  PHOS  --  4.3   GFR: Estimated Creatinine Clearance: 92 mL/min (A) (by C-G formula based on SCr of 1.28 mg/dL (H)). Liver Function Tests: Recent Labs  Lab 01/29/20 1825 01/30/20 0530  AST 68* 72*  ALT 75* 75*  ALKPHOS 74 76  BILITOT 0.7 0.6  PROT 7.8 7.4  ALBUMIN 4.0 3.6   No results for input(s): LIPASE, AMYLASE in the last 168 hours. No results for input(s):  AMMONIA in the last 168 hours. Coagulation Profile: No results for input(s): INR, PROTIME in the last 168 hours. Cardiac Enzymes: No results for input(s): CKTOTAL, CKMB, CKMBINDEX, TROPONINI in the last 168 hours. BNP (last 3 results) No results for input(s): PROBNP in the last 8760 hours. HbA1C: No results for input(s): HGBA1C in the last 72 hours. CBG: No results for input(s): GLUCAP in the last 168 hours. Lipid Profile: No results for input(s): CHOL, HDL, LDLCALC, TRIG, CHOLHDL, LDLDIRECT in the last 72 hours. Thyroid Function Tests: No results for input(s): TSH, T4TOTAL, FREET4, T3FREE, THYROIDAB in the last 72 hours. Anemia Panel: Recent Labs    01/30/20 0530  FERRITIN 766*   Sepsis Labs: Recent Labs  Lab 01/29/20 1825 01/29/20 2057 01/29/20 2125  PROCALCITON  --   --  0.15  LATICACIDVEN 1.0 1.6  --     Recent Results (from the past 240 hour(s))  Blood culture (single)     Status: None (Preliminary result)   Collection Time: 01/29/20  9:28 PM   Specimen: BLOOD  Result Value Ref Range Status   Specimen Description BLOOD LEFT ANTECUBITAL  Final   Special Requests   Final    BOTTLES DRAWN AEROBIC AND ANAEROBIC Blood Culture adequate volume   Culture   Final    NO GROWTH < 12 HOURS Performed at [facility], [address] [place], [place] [id number]    Report Status PENDING  Incomplete      Imaging Studies   DG Chest 2 View  Result Date: 01/29/2020 CLINICAL DATA:  Shortness of breath and  COVID-19 positivity EXAM: CHEST - 2 VIEW COMPARISON:  None. FINDINGS: Cardiac shadow is within normal limits. Scattered airspace opacities are noted throughout both lungs consistent with the given clinical history. No sizable effusion or pneumothorax is noted. No bony abnormality is seen. IMPRESSION: Patchy airspace opacities consistent with the given clinical history. Electronically Signed   By: Inez Catalina M.D.   On: 01/29/2020 18:53     Medications   Scheduled Meds: . vitamin C  500 mg Oral Daily  . baricitinib  4 mg Oral Daily  . dexamethasone (DECADRON) injection  6 mg Intravenous Q24H  . enoxaparin (LOVENOX) injection  40 mg Subcutaneous Q24H  . Ipratropium-Albuterol  1 puff Inhalation Q6H  . zinc sulfate  220 mg Oral Daily   Continuous Infusions: . remdesivir 100 mg in NS 100 mL         LOS: 1 day    Time spent: 30 minutes    Ezekiel Slocumb, DO [facility]  01/30/2020, 7:35 AM    If 7PM-7AM, please contact night-coverage. How to contact the [facility] Attending or Consulting provider [place] or covering provider during after hours [place], for this patient?    1. Check the care team in [facility] and look for a) attending/consulting [facility] provider listed and b) the [facility] team listed 2. Log into [email or web address] and use [place]'s universal password to access. If you do not have the password, please contact the hospital operator. 3. Locate the [facility] provider you are looking for under [facility] and page to a number that you can be directly reached. 4. If you still have difficulty reaching the provider, please page the [facility] (Director on Call) for the Hospitalists listed on amion for assistance.

## 2020-01-30 NOTE — ED Notes (Signed)
Pt given personal belongings bought in by family.

## 2020-01-30 NOTE — Hospital Course (Signed)
Lawrence Wolf is a 39 y.o. male with medical history of hypertension who was diagnosed with COVID-19 about 8 days prior to presenting to the ED on 01/29/20.   He reported initially symptoms of fever/chills, nausea and vomiting, and since developed shortness of breath, profound fatigue, diarrhea and presyncopal episodes of dizziness / lightheadedness, unable to even walk.    Evaluation in the ED - febrile at 102.6 F, normotensive, HR 101, RR 35, spO2 92% on room air.  Labs notable for mildly elevated LFT's AST 68, ALT 75, normal lactic acid, procal 0.15, mild thrombocytopenia Plts 142k.  Chest xray showed bilateral patchy infiltrates consistent with Covid-19 pneumonia.  Initial CRP 6.7.  Admitted for management of Covid-19 infection with both GI symptoms and multifocal pneumonia.

## 2020-01-31 DIAGNOSIS — U071 COVID-19: Secondary | ICD-10-CM | POA: Diagnosis not present

## 2020-01-31 DIAGNOSIS — A4189 Other specified sepsis: Secondary | ICD-10-CM | POA: Diagnosis not present

## 2020-01-31 LAB — CBC WITH DIFFERENTIAL/PLATELET
Abs Immature Granulocytes: 0.04 10*3/uL (ref 0.00–0.07)
Basophils Absolute: 0 10*3/uL (ref 0.0–0.1)
Basophils Relative: 0 %
Eosinophils Absolute: 0 10*3/uL (ref 0.0–0.5)
Eosinophils Relative: 0 %
HCT: 44.2 % (ref 39.0–52.0)
Hemoglobin: 14.8 g/dL (ref 13.0–17.0)
Immature Granulocytes: 1 %
Lymphocytes Relative: 23 %
Lymphs Abs: 1.4 10*3/uL (ref 0.7–4.0)
MCH: 28 pg (ref 26.0–34.0)
MCHC: 33.5 g/dL (ref 30.0–36.0)
MCV: 83.7 fL (ref 80.0–100.0)
Monocytes Absolute: 0.2 10*3/uL (ref 0.1–1.0)
Monocytes Relative: 3 %
Neutro Abs: 4.5 10*3/uL (ref 1.7–7.7)
Neutrophils Relative %: 73 %
Platelets: 189 10*3/uL (ref 150–400)
RBC: 5.28 MIL/uL (ref 4.22–5.81)
RDW: 14.4 % (ref 11.5–15.5)
WBC: 6.1 10*3/uL (ref 4.0–10.5)
nRBC: 0 % (ref 0.0–0.2)

## 2020-01-31 LAB — PROTIME-INR
INR: 0.9 (ref 0.8–1.2)
Prothrombin Time: 12.1 seconds (ref 11.4–15.2)

## 2020-01-31 LAB — C-REACTIVE PROTEIN: CRP: 4.1 mg/dL — ABNORMAL HIGH (ref <1.0)

## 2020-01-31 LAB — COMPREHENSIVE METABOLIC PANEL
ALT: 91 U/L — ABNORMAL HIGH (ref 0–44)
AST: 73 U/L — ABNORMAL HIGH (ref 15–41)
Albumin: 3.3 g/dL — ABNORMAL LOW (ref 3.5–5.0)
Alkaline Phosphatase: 70 U/L (ref 38–126)
Anion gap: 8 (ref 5–15)
BUN: 19 mg/dL (ref 6–20)
CO2: 27 mmol/L (ref 22–32)
Calcium: 8.6 mg/dL — ABNORMAL LOW (ref 8.9–10.3)
Chloride: 104 mmol/L (ref 98–111)
Creatinine, Ser: 1.33 mg/dL — ABNORMAL HIGH (ref 0.61–1.24)
GFR, Estimated: >60 mL/min (ref >60)
Glucose, Bld: 102 mg/dL — ABNORMAL HIGH (ref 70–99)
Potassium: 4.6 mmol/L (ref 3.5–5.1)
Sodium: 139 mmol/L (ref 135–145)
Total Bilirubin: 0.4 mg/dL (ref 0.3–1.2)
Total Protein: 6.9 g/dL (ref 6.5–8.1)

## 2020-01-31 LAB — FERRITIN: Ferritin: 930 ng/mL — ABNORMAL HIGH (ref 24–336)

## 2020-01-31 LAB — FIBRIN DERIVATIVES D-DIMER (ARMC ONLY): Fibrin derivatives D-dimer (ARMC): 631.38 ng/mL (FEU) — ABNORMAL HIGH (ref 0.00–499.00)

## 2020-01-31 MED ORDER — METHYLPREDNISOLONE SODIUM SUCC 40 MG IJ SOLR
40.0000 mg | Freq: Two times a day (BID) | INTRAMUSCULAR | Status: DC
  Administered 2020-01-31 – 2020-02-03 (×6): 40 mg via INTRAVENOUS
  Filled 2020-01-31 (×7): qty 1

## 2020-01-31 MED ORDER — ENOXAPARIN SODIUM 60 MG/0.6ML ~~LOC~~ SOLN
0.5000 mg/kg | SUBCUTANEOUS | Status: DC
  Administered 2020-01-31 – 2020-02-02 (×3): 47.5 mg via SUBCUTANEOUS
  Filled 2020-01-31 (×3): qty 0.6

## 2020-01-31 NOTE — Progress Notes (Signed)
PROGRESS NOTE    Lawrence Wolf   OIZ:[id number]  DOB: 1981-02-04  PCP: [facility]    DOA: 01/29/2020 LOS: 2   Brief Narrative   Lawrence Wolf is a 39 y.o. male with medical history of hypertension who was diagnosed with COVID-19 about 8 days prior to presenting to the ED on 01/29/20.   He reported initially symptoms of fever/chills, nausea and vomiting, and since developed shortness of breath, profound fatigue, diarrhea and presyncopal episodes of dizziness / lightheadedness, unable to even walk.    Evaluation in the ED - febrile at 102.6 F, normotensive, HR 101, RR 35, spO2 92% on room air.  Labs notable for mildly elevated LFT's AST 68, ALT 75, normal lactic acid, procal 0.15, mild thrombocytopenia Plts 142k.  Chest xray showed bilateral patchy infiltrates consistent with Covid-19 pneumonia.  Initial CRP 6.7.  Admitted for management of Covid-19 infection with both GI symptoms and multifocal pneumonia.      Assessment & Plan   Active Problems:   Sepsis due to COVID-19 [facility])   Sepsis secondary to Covid-19 Infection with Pneumonia and Gastroenteritis - POA with positive Covid-19 test outpatient, presented with above symptoms, both GI and respiratory.  Not hypoxic on presentation.  Chest xray showing pneumonia.  Met sepsis criteria with tachycardia, tachypnea and known source Covid-19 infection. Acute respiratory failure with hyoxia - due to Covid-19 --Continue remdesivir (10/22 >>) --Resume steroids, Solu-medrol 40 mg IV BID --Continue baricitinib (10/23 >>) --Resume IV fluids given presyncopal symptoms --Antitussives PRN --Combivent q6h --Vitamin C, D3, zinc --follow blood culture --prone or lateral decubitus positioning as tolerated --monitor spO2, use oxygen as needed to keep spO2 at or above 90% --follow inflammatory markers, CMP, CBC   Abdominal pain - Resolved.  Was present on admission.  Unclear cause, likely related to Covid infection.  Does have  mildly elevated LFT's. --Trial of protonix and PRN simethicone for now. --RUQ Ultrasound pending, tomorrow AM, NPO after midnight --Consider CT abdomen/pelvis for further evaluation.   Thrombocytopenia - present on admission with Plts 142k >> 144k.  Likely due to sepsis vs liver disease (has mild LFT elevation as well).   Elevated LFT's - present on admission, AST 68, ALT 75. --Follow CMP's, monitor closely with remdesivir and baricitinib, okay to continue for now --RUQ ultrasound tomorrow --PT/INR are normal   DVT prophylaxis:    Diet:  Diet Orders (From admission, onward)    Start     Ordered   01/29/20 2219  Diet regular Room service appropriate? Yes; Fluid consistency: Thin  Diet effective now       Question Answer Comment  Room service appropriate? Yes   Fluid consistency: Thin      01/29/20 2219            Code Status: Full Code    Subjective 01/31/20    Pt seen at bedside this AM.  He reports feeling even more weak today than he had previously.  Says can't tell if still having abdominal pain at this time.  Did get nauseous after trying to eat eggs this morning for breakfast, denies vomiting.  Having fevers today, Tmax 101.1 this morning.   Disposition Plan & Communication   Status is: Inpatient  Remains inpatient appropriate because:IV treatments appropriate due to intensity of illness or inability to take PO.  Pt now requiring oxygen.   Dispo: The patient is from: Home              Anticipated d/c is  to: Home              Anticipated d/c date is: 2-3 days              Patient currently is not medically stable to d/c.    Family Communication: spoke with patient's wife, Lawrence Wolf, by phone this evening 10/24.    Consults, Procedures, Significant Events   Consultants:   None  Procedures:   None  Antimicrobials:  Anti-infectives (From admission, onward)   Start     Dose/Rate Route Frequency Ordered Stop   01/30/20 1000  remdesivir 100 mg in  sodium chloride 0.9 % 100 mL IVPB       "Followed by" Linked Group Details   100 mg 200 mL/hr over 30 Minutes Intravenous Daily 01/29/20 2205 02/03/20 0959   01/30/20 1000  remdesivir 100 mg in sodium chloride 0.9 % 100 mL IVPB  Status:  Discontinued       "Followed by" Linked Group Details   100 mg 200 mL/hr over 30 Minutes Intravenous Daily 01/29/20 2219 01/29/20 2226   01/29/20 2230  remdesivir 200 mg in sodium chloride 0.9% 250 mL IVPB  Status:  Discontinued       "Followed by" Linked Group Details   200 mg 580 mL/hr over 30 Minutes Intravenous Once 01/29/20 2219 01/29/20 2226   01/29/20 2215  remdesivir 200 mg in sodium chloride 0.9% 250 mL IVPB       "Followed by" Linked Group Details   200 mg 580 mL/hr over 30 Minutes Intravenous Once 01/29/20 2205 01/30/20 0136         Objective   Vitals:   01/31/20 1037 01/31/20 1114 01/31/20 1424 01/31/20 1737  BP:  130/73 122/75 (!) 151/105  Pulse:  85 73 77  Resp:  (!) 21 (!) 22 (!) 23  Temp: (!) 100.4 F (38 C) 99.8 F (37.7 C) 98.1 F (36.7 C) (!) 100.6 F (38.1 C)  TempSrc: Oral Oral Oral Oral  SpO2:  94% 94% 99%  Weight:      Height:        Intake/Output Summary (Last 24 hours) at 01/31/2020 1759 Last data filed at 01/31/2020 1733 Gross per 24 hour  Intake 2361.6 ml  Output 2050 ml  Net 311.6 ml   Filed Weights   01/29/20 1814  Weight: 96.2 kg    Physical Exam:  General exam: awake, alert, no acute distress, mildly ill-appearing Respiratory system: decreased breath sounds, shallow inspirations, no wheezes or rhonchi, normal respiratory effort, on 2 L/min oxygen by nasal cannula. Cardiovascular system: normal S1/S2, RRR, no pedal edema.   Gastrointestinal system: soft, non-tender, not distended Central nervous system: A&O x4. no gross focal neurologic deficits, normal speech Psychiatry: normal mood, congruent affect, judgement and insight appear normal  Labs   Data Reviewed: I have personally reviewed  following labs and imaging studies  CBC: Recent Labs  Lab 01/29/20 1825 01/30/20 0530 01/31/20 0453  WBC 5.7 5.8 6.1  NEUTROABS 4.7 5.3 4.5  HGB 15.9 15.2 14.8  HCT 47.4 45.2 44.2  MCV 82.3 83.9 83.7  PLT 142* 144* 782   Basic Metabolic Panel: Recent Labs  Lab 01/29/20 1825 01/30/20 0530 01/31/20 0453  NA 137 138 139  K 4.0 4.6 4.6  CL 101 103 104  CO2 24 26 27   GLUCOSE 114* 181* 102*  BUN 14 13 19   CREATININE 1.22 1.28* 1.33*  CALCIUM 8.7* 8.4* 8.6*  MG  --  2.4  --   PHOS  --  4.3  --    GFR: Estimated Creatinine Clearance: 88.5 mL/min (A) (by C-G formula based on SCr of 1.33 mg/dL (H)). Liver Function Tests: Recent Labs  Lab 01/29/20 1825 01/30/20 0530 01/31/20 0453  AST 68* 72* 73*  ALT 75* 75* 91*  ALKPHOS 74 76 70  BILITOT 0.7 0.6 0.4  PROT 7.8 7.4 6.9  ALBUMIN 4.0 3.6 3.3*   No results for input(s): LIPASE, AMYLASE in the last 168 hours. No results for input(s): AMMONIA in the last 168 hours. Coagulation Profile: Recent Labs  Lab 01/31/20 0453  INR 0.9   Cardiac Enzymes: No results for input(s): CKTOTAL, CKMB, CKMBINDEX, TROPONINI in the last 168 hours. BNP (last 3 results) No results for input(s): PROBNP in the last 8760 hours. HbA1C: No results for input(s): HGBA1C in the last 72 hours. CBG: No results for input(s): GLUCAP in the last 168 hours. Lipid Profile: No results for input(s): CHOL, HDL, LDLCALC, TRIG, CHOLHDL, LDLDIRECT in the last 72 hours. Thyroid Function Tests: No results for input(s): TSH, T4TOTAL, FREET4, T3FREE, THYROIDAB in the last 72 hours. Anemia Panel: Recent Labs    01/30/20 0530 01/31/20 0453  FERRITIN 766* 930*   Sepsis Labs: Recent Labs  Lab 01/29/20 1825 01/29/20 2057 01/29/20 2125  PROCALCITON  --   --  0.15  LATICACIDVEN 1.0 1.6  --     Recent Results (from the past 240 hour(s))  Blood culture (single)     Status: None (Preliminary result)   Collection Time: 01/29/20  9:28 PM   Specimen: BLOOD    Result Value Ref Range Status   Specimen Description BLOOD LEFT ANTECUBITAL  Final   Special Requests   Final    BOTTLES DRAWN AEROBIC AND ANAEROBIC Blood Culture adequate volume   Culture   Final    NO GROWTH 2 DAYS Performed at [facility], [address] [place], [place] [address]    Report Status PENDING  Incomplete      Imaging Studies   DG Chest 2 View  Result Date: 01/29/2020 CLINICAL DATA:  Shortness of breath and COVID-19 positivity EXAM: CHEST - 2 VIEW COMPARISON:  None. FINDINGS: Cardiac shadow is within normal limits. Scattered airspace opacities are noted throughout both lungs consistent with the given clinical history. No sizable effusion or pneumothorax is noted. No bony abnormality is seen. IMPRESSION: Patchy airspace opacities consistent with the given clinical history. Electronically Signed   By: Inez Catalina M.D.   On: 01/29/2020 18:53     Medications   Scheduled Meds: . vitamin C  500 mg Oral Daily  . baricitinib  4 mg Oral Daily  . cholecalciferol  1,000 Units Oral Daily  . enoxaparin (LOVENOX) injection  0.5 mg/kg Subcutaneous Q24H  . Ipratropium-Albuterol  1 puff Inhalation Q6H  . pantoprazole  40 mg Oral Daily  . polyethylene glycol  17 g Oral Daily  . zinc sulfate  220 mg Oral Daily   Continuous Infusions: . sodium chloride 100 mL/hr at 01/31/20 1418  . remdesivir 100 mg in NS 100 mL Stopped (01/31/20 0929)       LOS: 2 days    Time spent: 25 minutes with > 50% spent in coordination of care and direct patient contact.    Ezekiel Slocumb, DO [facility]  01/31/2020, 5:59 PM    If 7PM-7AM, please contact night-coverage. How to contact the [facility] Attending or Consulting provider [place] or covering provider during after hours [place], for this patient?  1. Check the care team in [facility] and look for a) attending/consulting [facility] provider listed and b) the [facility] team listed 2. Log into [email or web address] and use [place]'s  universal password to access. If you do not have the password, please contact the hospital operator. 3. Locate the [facility] provider you are looking for under [facility] and page to a number that you can be directly reached. 4. If you still have difficulty reaching the provider, please page the [facility] (Director on Call) for the Hospitalists listed on amion for assistance.

## 2020-01-31 NOTE — Plan of Care (Signed)
  Problem: Education: Goal: Knowledge of risk factors and measures for prevention of condition will improve Outcome: Progressing   

## 2020-01-31 NOTE — Progress Notes (Addendum)
   01/31/20 1814  Treat  Complains of Fever  Interventions Medication (see MAR)  PRN Tylenol give for elevated temperature; follow up to be on the next shift unless pt needs arise

## 2020-01-31 NOTE — Progress Notes (Signed)
   01/31/20 1737  Assess: MEWS Score  Temp (!) 100.6 F (38.1 C)  BP (!) 151/105  Pulse Rate 77  Resp (!) 23  Level of Consciousness Alert  SpO2 99 %  O2 Device Nasal Cannula  O2 Flow Rate (L/min) 2 L/min  Assess: MEWS Score  MEWS Temp 1  MEWS Systolic 0  MEWS Pulse 0  MEWS RR 1  MEWS LOC 0  MEWS Score 2  MEWS Score Color Yellow  Assess: if the MEWS score is Yellow or Red  Were vital signs taken at a resting state? Yes  NT to recheck VS

## 2020-01-31 NOTE — Progress Notes (Signed)
   01/31/20 0906  Treat  MEWS Interventions Administered prn meds/treatments  Complains of Fever  Interventions Medication (see MAR)  PRN Tylenol given for temperature of 101.1; applied O2 2L [place] due to SOB at rest; adjusted room temperature to be more comfortable for the pt; will continue to monitor the pt

## 2020-01-31 NOTE — Progress Notes (Signed)
   01/31/20 1424  Assess: MEWS Score  Temp 98.1 F (36.7 C)  BP 122/75  Pulse Rate 73  Resp (!) 22  Level of Consciousness Alert  SpO2 94 %  O2 Device Nasal Cannula  O2 Flow Rate (L/min) 2 L/min  Assess: MEWS Score  MEWS Temp 0  MEWS Systolic 0  MEWS Pulse 0  MEWS RR 1  MEWS LOC 0  MEWS Score 1  MEWS Score Color Green  Assess: if the MEWS score is Yellow or Red  Were vital signs taken at a resting state? Yes  Focused Assessment No change from prior assessment  Document  Patient Outcome Other (Comment) (continue to monitor pt)  Progress note created (see row info) Yes  pt verbalized that he is feeling much better this afternoon; will continue to monitor pt

## 2020-01-31 NOTE — Progress Notes (Signed)
Updated and answered pt's spouse's, Myeshia Cavallero, questions via telephone call

## 2020-01-31 NOTE — Progress Notes (Signed)
Pt with nausea and vomiting; pt verbalized that he feels much better after getting rid of mucous, no food nor medications noted by the pt in his emesis (had just given Tylenol PRN for fever); pt denies need for PRN nausea and vomiting medicine; will continue to monitor pt; pt will be NPO after midnight for an abdominal ultrasound

## 2020-01-31 NOTE — Progress Notes (Signed)
   01/31/20 1759  Assess: MEWS Score  Temp 100.3 F (37.9 C)  BP 135/88  Pulse Rate 79  Resp 20  Level of Consciousness Alert  SpO2 96 %  O2 Device Nasal Cannula  O2 Flow Rate (L/min) 2 L/min  Assess: MEWS Score  MEWS Temp 0  MEWS Systolic 0  MEWS Pulse 0  MEWS RR 0  MEWS LOC 0  MEWS Score 0  MEWS Score Color Green  Assess: if the MEWS score is Yellow or Red  Were vital signs taken at a resting state? Yes  Focused Assessment No change from prior assessment  Document  Patient Outcome Other (Comment) (cotinue to assess)  VS result of recheck for MEWS accuracy; continue to monitor pt

## 2020-02-01 ENCOUNTER — Inpatient Hospital Stay: Payer: Medicaid Other

## 2020-02-01 DIAGNOSIS — U071 COVID-19: Secondary | ICD-10-CM | POA: Diagnosis not present

## 2020-02-01 DIAGNOSIS — A4189 Other specified sepsis: Secondary | ICD-10-CM | POA: Diagnosis not present

## 2020-02-01 LAB — CBC WITH DIFFERENTIAL/PLATELET
Abs Immature Granulocytes: 0.06 10*3/uL (ref 0.00–0.07)
Basophils Absolute: 0 10*3/uL (ref 0.0–0.1)
Basophils Relative: 0 %
Eosinophils Absolute: 0 10*3/uL (ref 0.0–0.5)
Eosinophils Relative: 0 %
HCT: 46.9 % (ref 39.0–52.0)
Hemoglobin: 15.4 g/dL (ref 13.0–17.0)
Immature Granulocytes: 1 %
Lymphocytes Relative: 11 %
Lymphs Abs: 0.6 10*3/uL — ABNORMAL LOW (ref 0.7–4.0)
MCH: 27.5 pg (ref 26.0–34.0)
MCHC: 32.8 g/dL (ref 30.0–36.0)
MCV: 83.9 fL (ref 80.0–100.0)
Monocytes Absolute: 0.2 10*3/uL (ref 0.1–1.0)
Monocytes Relative: 3 %
Neutro Abs: 5.1 10*3/uL (ref 1.7–7.7)
Neutrophils Relative %: 85 %
Platelets: 214 10*3/uL (ref 150–400)
RBC: 5.59 MIL/uL (ref 4.22–5.81)
RDW: 14.4 % (ref 11.5–15.5)
WBC: 6 10*3/uL (ref 4.0–10.5)
nRBC: 0 % (ref 0.0–0.2)

## 2020-02-01 LAB — COMPREHENSIVE METABOLIC PANEL
ALT: 120 U/L — ABNORMAL HIGH (ref 0–44)
AST: 79 U/L — ABNORMAL HIGH (ref 15–41)
Albumin: 3.4 g/dL — ABNORMAL LOW (ref 3.5–5.0)
Alkaline Phosphatase: 76 U/L (ref 38–126)
Anion gap: 10 (ref 5–15)
BUN: 18 mg/dL (ref 6–20)
CO2: 27 mmol/L (ref 22–32)
Calcium: 9 mg/dL (ref 8.9–10.3)
Chloride: 102 mmol/L (ref 98–111)
Creatinine, Ser: 1.19 mg/dL (ref 0.61–1.24)
GFR, Estimated: >60 mL/min (ref >60)
Glucose, Bld: 157 mg/dL — ABNORMAL HIGH (ref 70–99)
Potassium: 4.9 mmol/L (ref 3.5–5.1)
Sodium: 139 mmol/L (ref 135–145)
Total Bilirubin: 0.7 mg/dL (ref 0.3–1.2)
Total Protein: 7.3 g/dL (ref 6.5–8.1)

## 2020-02-01 LAB — C-REACTIVE PROTEIN: CRP: 5.2 mg/dL — ABNORMAL HIGH (ref <1.0)

## 2020-02-01 LAB — FIBRIN DERIVATIVES D-DIMER (ARMC ONLY): Fibrin derivatives D-dimer (ARMC): 666.65 ng/mL (FEU) — ABNORMAL HIGH (ref 0.00–499.00)

## 2020-02-01 LAB — FERRITIN: Ferritin: 955 ng/mL — ABNORMAL HIGH (ref 24–336)

## 2020-02-01 LAB — CULTURE, BLOOD (SINGLE): Special Requests: ADEQUATE

## 2020-02-01 MED ORDER — ENSURE ENLIVE PO LIQD
237.0000 mL | Freq: Three times a day (TID) | ORAL | Status: DC
  Administered 2020-02-01 – 2020-02-03 (×6): 237 mL via ORAL

## 2020-02-01 NOTE — Progress Notes (Signed)
Initial Nutrition Assessment  RD working remotely.  DOCUMENTATION CODES:   Not applicable  INTERVENTION:  Provide Ensure Enlive po TID, each supplement provides 350 kcal and 20 grams of protein.  Encouraged adequate intake of calories and protein at meals.   NUTRITION DIAGNOSIS:   Increased nutrient needs related to catabolic illness (COVID-19) as evidenced by estimated needs.  GOAL:   Patient will meet greater than or equal to 90% of their needs  MONITOR:   PO intake, Supplement acceptance, Labs, Weight trends, I & O's  REASON FOR ASSESSMENT:   Malnutrition Screening Tool    ASSESSMENT:   39 year old male with PMHx of HTN admitted with COVID-19 PNA and gastroenteritis.   Spoke with patient over the phone. He reports he had decreased appetite and intake for approximately 10 days since he was diagnosed with COVID-19. He endorses difficulty eating due to nausea, abdominal pain, and diarrhea. He reports he is feeling better today. He was able to eat 100% of his breakfast this morning. Discussed increased nutrient needs related to catabolic nature of COVID-19. Patient is amenable to drinking oral nutrition supplements to help meet calorie/protein needs.  Patient reports his UBW is around 230 lbs and he had lost down to 212 lbs on his scale at home prior to admission. That is a weight loss of 18 lbs (7.8% body weight) over 7-10 days, which is significant for time frame. However no current weight from admission as weight from 10/22 was a stated weight.  Medications reviewed and include: vitamin C 500 mg daily, vitamin D3 1000 units daily, Solu-Medrol 40 mg BID IV, Protonix, Miralax 17 grams daily PO, zinc sulfate 220 mg daily, NS at 75 mL/hr, remdesivir.  Labs reviewed.  Patient is at risk for acute malnutrition.  NUTRITION - FOCUSED PHYSICAL EXAM:  Deferred as RD is working remotely.  Diet Order:   Diet Order            Diet regular Room service appropriate? Yes; Fluid  consistency: Thin  Diet effective now                EDUCATION NEEDS:   No education needs have been identified at this time  Skin:  Skin Assessment: Reviewed RN Assessment  Last BM:  01/30/2020  Height:   Ht Readings from Last 1 Encounters:  01/29/20 5\' 10"  (1.778 m)   Weight:   Wt Readings from Last 1 Encounters:  01/29/20 96.2 kg   BMI:  Body mass index is 30.42 kg/m.  Estimated Nutritional Needs:   Kcal:  2400-2600  Protein:  120-130 grams  Fluid:  >/= 2.4 L/day  01/31/20, MS, RD, LDN Pager number available on Amion

## 2020-02-01 NOTE — Progress Notes (Signed)
Pts wife given daily update 

## 2020-02-01 NOTE — Progress Notes (Signed)
PROGRESS NOTE    Lawrence Wolf   OIZ:[id number]  DOB: [birth year]  PCP: [facility]    DOA: 01/29/2020 LOS: 3   Brief Narrative   Lawrence Wolf is a 39 y.o. male with medical history of hypertension who was diagnosed with COVID-19 about 8 days prior to presenting to the ED on 01/29/20.   He reported initially symptoms of fever/chills, nausea and vomiting, and since developed shortness of breath, profound fatigue, diarrhea and presyncopal episodes of dizziness / lightheadedness, unable to even walk.    Evaluation in the ED - febrile at 102.6 F, normotensive, HR 101, RR 35, spO2 92% on room air.  Labs notable for mildly elevated LFT's AST 68, ALT 75, normal lactic acid, procal 0.15, mild thrombocytopenia Plts 142k.  Chest xray showed bilateral patchy infiltrates consistent with Covid-19 pneumonia.  Initial CRP 6.7.  Admitted for management of Covid-19 infection with both GI symptoms and multifocal pneumonia.      Assessment & Plan   Active Problems:   Sepsis due to COVID-19 [facility])   Sepsis secondary to Covid-19 Infection with Pneumonia and Gastroenteritis - POA with positive Covid-19 test outpatient, presented with above symptoms, both GI and respiratory.  Not hypoxic on presentation.  Chest xray showing pneumonia.  Met sepsis criteria with tachycardia, tachypnea and known source Covid-19 infection. Acute respiratory failure with hyoxia - due to Covid-19 --Continue remdesivir (10/22 >>) --Continue Solu-medrol 40 mg IV BID --Continue baricitinib (10/23 >>) --Stop IV fluids and monitor --Antitussives PRN --Combivent q6h --Vitamin C, D3, zinc --follow blood culture - neg to date --prone or lateral decubitus positioning as tolerated --monitor spO2, use oxygen as needed to keep spO2 at or above 90% --follow inflammatory markers, CMP, CBC   Abdominal pain - Resolved.  Was present on admission.  Unclear cause, likely related to Covid infection.  Does have mildly  elevated LFT's.  RUQ ultrasound showed nothing acute, hepatic steatosis. --Trial of protonix and PRN simethicone for now. --Consider CT abdomen/pelvis for further evaluation.   Thrombocytopenia - Resolved.  Present on admission with Plts 142k >> 144k>>189k as of 10/24.   Likely due to sepsis vs liver disease (has mild LFT elevation as well).   Elevated LFT's - present on admission, AST 68, ALT 75. --Follow CMP's, monitor closely with remdesivir and baricitinib, okay to continue for now --RUQ ultrasound 10/25 shows likely hepatic steatosis --PT/INR are normal Discusses ultrasound results with patient.  Advised moderate/limited alcohol use.   Unclear what supplements he takes for working out, but could also be contributing to this.   DVT prophylaxis: wt-based Lovenox   Diet:  Diet Orders (From admission, onward)    Start     Ordered   02/01/20 0756  Diet regular Room service appropriate? Yes; Fluid consistency: Thin  Diet effective now       Question Answer Comment  Room service appropriate? Yes   Fluid consistency: Thin      02/01/20 0756            Code Status: Full Code    Subjective 02/01/20    Pt seen at bedside this AM.  Says he is feeling somewhat better today.  Little strength and energy coming back.  Denies abdominal pain, N/V today.  Reported yesterday he did get nauseous and vomited once.  Still with cough as well.     Disposition Plan & Communication   Status is: Inpatient  Remains inpatient appropriate because:IV treatments appropriate due to intensity of illness or inability  to take PO.  Pt continues requiring oxygen, receiving IV therapies as above.   Dispo: The patient is from: Home              Anticipated d/c is to: Home              Anticipated d/c date is: 2 days              Patient currently is not medically stable to d/c.    Family Communication: attempted to reach patient's wife by phone this afternoon 10/25, got voicemail.       Consults, Procedures, Significant Events   Consultants:   None  Procedures:   None  Antimicrobials:  Anti-infectives (From admission, onward)   Start     Dose/Rate Route Frequency Ordered Stop   01/30/20 1000  remdesivir 100 mg in sodium chloride 0.9 % 100 mL IVPB       "Followed by" Linked Group Details   100 mg 200 mL/hr over 30 Minutes Intravenous Daily 01/29/20 2205 02/03/20 0959   01/30/20 1000  remdesivir 100 mg in sodium chloride 0.9 % 100 mL IVPB  Status:  Discontinued       "Followed by" Linked Group Details   100 mg 200 mL/hr over 30 Minutes Intravenous Daily 01/29/20 2219 01/29/20 2226   01/29/20 2230  remdesivir 200 mg in sodium chloride 0.9% 250 mL IVPB  Status:  Discontinued       "Followed by" Linked Group Details   200 mg 580 mL/hr over 30 Minutes Intravenous Once 01/29/20 2219 01/29/20 2226   01/29/20 2215  remdesivir 200 mg in sodium chloride 0.9% 250 mL IVPB       "Followed by" Linked Group Details   200 mg 580 mL/hr over 30 Minutes Intravenous Once 01/29/20 2205 01/30/20 0136         Objective   Vitals:   02/01/20 0457 02/01/20 0810 02/01/20 0817 02/01/20 1224  BP: 118/80 129/87  130/79  Pulse: (!) 58 73  71  Resp: 20 19  20   Temp: 98 F (36.7 C) 97.9 F (36.6 C)  (!) 97.4 F (36.3 C)  TempSrc: Oral Oral  Oral  SpO2: 97% 91% 90% 97%  Weight:      Height:        Intake/Output Summary (Last 24 hours) at 02/01/2020 1403 Last data filed at 02/01/2020 1338 Gross per 24 hour  Intake 579.95 ml  Output 3450 ml  Net -2870.05 ml   Filed Weights   01/29/20 1814  Weight: 96.2 kg    Physical Exam:  General exam: awake, alert, no acute distress, mildly ill-appearing Respiratory system: clear bilaterally, diminished bases, shallow inspirations, no wheezes or rhonchi, normal respiratory effort, on 2 L/min oxygen by nasal cannula. Cardiovascular system: normal S1/S2, RRR, no pedal edema.   Gastrointestinal system: soft, non-tender, not  distended Central nervous system: A&O x4. no gross focal neurologic deficits, normal speech Psychiatry: normal mood, congruent affect, judgement and insight appear normal  Labs   Data Reviewed: I have personally reviewed following labs and imaging studies  CBC: Recent Labs  Lab 01/29/20 1825 01/30/20 0530 01/31/20 0453 02/01/20 0611  WBC 5.7 5.8 6.1 6.0  NEUTROABS 4.7 5.3 4.5 5.1  HGB 15.9 15.2 14.8 15.4  HCT 47.4 45.2 44.2 46.9  MCV 82.3 83.9 83.7 83.9  PLT 142* 144* 189 527   Basic Metabolic Panel: Recent Labs  Lab 01/29/20 1825 01/30/20 0530 01/31/20 0453 02/01/20 0611  NA 137 138 139 139  K 4.0 4.6 4.6 4.9  CL 101 103 104 102  CO2 24 26 27 27   GLUCOSE 114* 181* 102* 157*  BUN 14 13 19 18   CREATININE 1.22 1.28* 1.33* 1.19  CALCIUM 8.7* 8.4* 8.6* 9.0  MG  --  2.4  --   --   PHOS  --  4.3  --   --    GFR: Estimated Creatinine Clearance: 98.9 mL/min (by C-G formula based on SCr of 1.19 mg/dL). Liver Function Tests: Recent Labs  Lab 01/29/20 1825 01/30/20 0530 01/31/20 0453 02/01/20 0611  AST 68* 72* 73* 79*  ALT 75* 75* 91* 120*  ALKPHOS 74 76 70 76  BILITOT 0.7 0.6 0.4 0.7  PROT 7.8 7.4 6.9 7.3  ALBUMIN 4.0 3.6 3.3* 3.4*   No results for input(s): LIPASE, AMYLASE in the last 168 hours. No results for input(s): AMMONIA in the last 168 hours. Coagulation Profile: Recent Labs  Lab 01/31/20 0453  INR 0.9   Cardiac Enzymes: No results for input(s): CKTOTAL, CKMB, CKMBINDEX, TROPONINI in the last 168 hours. BNP (last 3 results) No results for input(s): PROBNP in the last 8760 hours. HbA1C: No results for input(s): HGBA1C in the last 72 hours. CBG: No results for input(s): GLUCAP in the last 168 hours. Lipid Profile: No results for input(s): CHOL, HDL, LDLCALC, TRIG, CHOLHDL, LDLDIRECT in the last 72 hours. Thyroid Function Tests: No results for input(s): TSH, T4TOTAL, FREET4, T3FREE, THYROIDAB in the last 72 hours. Anemia Panel: Recent Labs     01/31/20 0453 02/01/20 0611  FERRITIN 930* 955*   Sepsis Labs: Recent Labs  Lab 01/29/20 1825 01/29/20 2057 01/29/20 2125  PROCALCITON  --   --  0.15  LATICACIDVEN 1.0 1.6  --     Recent Results (from the past 240 hour(s))  Blood culture (single)     Status: None (Preliminary result)   Collection Time: 01/29/20  9:28 PM   Specimen: BLOOD  Result Value Ref Range Status   Specimen Description BLOOD LEFT ANTECUBITAL  Final   Special Requests   Final    BOTTLES DRAWN AEROBIC AND ANAEROBIC Blood Culture adequate volume   Culture   Final    NO GROWTH 3 DAYS Performed at [facility], [address] [place], [place] [address]    Report Status PENDING  Incomplete      Imaging Studies   US Abdomen Limited RUQ (LIVER/GB)  Result Date: 02/01/2020 CLINICAL DATA:  Elevated liver function tests. EXAM: ULTRASOUND ABDOMEN LIMITED RIGHT UPPER QUADRANT COMPARISON:  None. FINDINGS: Gallbladder: No gallstones or wall thickening visualized. No sonographic Murphy sign noted by sonographer. Common bile duct: Diameter: 3 mm which is within normal limits. Liver: No focal lesion identified. Increased echogenicity of hepatic parenchyma is noted suggesting hepatic steatosis or other diffuse hepatocellular disease. Portal vein is patent on color Doppler imaging with normal direction of blood flow towards the liver. Other: None. IMPRESSION: Increased echogenicity of hepatic parenchyma is noted suggesting hepatic steatosis or other diffuse hepatocellular disease. No other abnormality is noted in the right upper quadrant of the abdomen. Electronically Signed   By: Marijo Conception M.D.   On: 02/01/2020 08:08     Medications   Scheduled Meds: . vitamin C  500 mg Oral Daily  . baricitinib  4 mg Oral Daily  . cholecalciferol  1,000 Units Oral Daily  . enoxaparin (LOVENOX) injection  0.5 mg/kg Subcutaneous Q24H  . feeding supplement  237 mL Oral TID BM  .  Ipratropium-Albuterol  1 puff  Inhalation Q6H  . methylPREDNISolone (SOLU-MEDROL) injection  40 mg Intravenous BID  . pantoprazole  40 mg Oral Daily  . polyethylene glycol  17 g Oral Daily  . zinc sulfate  220 mg Oral Daily   Continuous Infusions: . sodium chloride 75 mL/hr at 02/01/20 0940  . remdesivir 100 mg in NS 100 mL 100 mg (02/01/20 0811)       LOS: 3 days    Time spent: 25 minutes with > 50% spent in coordination of care and direct patient contact.    Ezekiel Slocumb, DO [facility]  02/01/2020, 2:03 PM    If 7PM-7AM, please contact night-coverage. How to contact the [facility] Attending or Consulting provider [place] or covering provider during after hours [employer], for this patient?    1. Check the care team in [facility] and look for a) attending/consulting [facility] provider listed and b) the [facility] team listed 2. Log into [email or web address] and use [facility] universal password to access. If you do not have the password, please contact the hospital operator. 3. Locate the [facility] provider you are looking for under [facility] and page to a number that you can be directly reached. 4. If you still have difficulty reaching the provider, please page the [facility] (Director on Call) for the Hospitalists listed on amion for assistance.

## 2020-02-02 DIAGNOSIS — U071 COVID-19: Secondary | ICD-10-CM | POA: Diagnosis not present

## 2020-02-02 DIAGNOSIS — A4189 Other specified sepsis: Secondary | ICD-10-CM | POA: Diagnosis not present

## 2020-02-02 LAB — CBC WITH DIFFERENTIAL/PLATELET
Abs Immature Granulocytes: 0.08 10*3/uL — ABNORMAL HIGH (ref 0.00–0.07)
Basophils Absolute: 0 10*3/uL (ref 0.0–0.1)
Basophils Relative: 0 %
Eosinophils Absolute: 0 10*3/uL (ref 0.0–0.5)
Eosinophils Relative: 0 %
HCT: 46.8 % (ref 39.0–52.0)
Hemoglobin: 15.6 g/dL (ref 13.0–17.0)
Immature Granulocytes: 1 %
Lymphocytes Relative: 11 %
Lymphs Abs: 0.8 10*3/uL (ref 0.7–4.0)
MCH: 27.8 pg (ref 26.0–34.0)
MCHC: 33.3 g/dL (ref 30.0–36.0)
MCV: 83.3 fL (ref 80.0–100.0)
Monocytes Absolute: 0.3 10*3/uL (ref 0.1–1.0)
Monocytes Relative: 4 %
Neutro Abs: 6.1 10*3/uL (ref 1.7–7.7)
Neutrophils Relative %: 84 %
Platelets: 266 10*3/uL (ref 150–400)
RBC: 5.62 MIL/uL (ref 4.22–5.81)
RDW: 14.1 % (ref 11.5–15.5)
WBC: 7.3 10*3/uL (ref 4.0–10.5)
nRBC: 0 % (ref 0.0–0.2)

## 2020-02-02 LAB — COMPREHENSIVE METABOLIC PANEL
ALT: 135 U/L — ABNORMAL HIGH (ref 0–44)
AST: 72 U/L — ABNORMAL HIGH (ref 15–41)
Albumin: 3.3 g/dL — ABNORMAL LOW (ref 3.5–5.0)
Alkaline Phosphatase: 76 U/L (ref 38–126)
Anion gap: 9 (ref 5–15)
BUN: 19 mg/dL (ref 6–20)
CO2: 26 mmol/L (ref 22–32)
Calcium: 9 mg/dL (ref 8.9–10.3)
Chloride: 101 mmol/L (ref 98–111)
Creatinine, Ser: 1.13 mg/dL (ref 0.61–1.24)
GFR, Estimated: >60 mL/min (ref >60)
Glucose, Bld: 201 mg/dL — ABNORMAL HIGH (ref 70–99)
Potassium: 5 mmol/L (ref 3.5–5.1)
Sodium: 136 mmol/L (ref 135–145)
Total Bilirubin: 0.5 mg/dL (ref 0.3–1.2)
Total Protein: 6.9 g/dL (ref 6.5–8.1)

## 2020-02-02 LAB — FIBRIN DERIVATIVES D-DIMER (ARMC ONLY): Fibrin derivatives D-dimer (ARMC): 509.67 ng/mL (FEU) — ABNORMAL HIGH (ref 0.00–499.00)

## 2020-02-02 LAB — C-REACTIVE PROTEIN: CRP: 2.5 mg/dL — ABNORMAL HIGH (ref <1.0)

## 2020-02-02 LAB — FERRITIN: Ferritin: 968 ng/mL — ABNORMAL HIGH (ref 24–336)

## 2020-02-02 NOTE — Plan of Care (Signed)
  Problem: Education: Goal: Knowledge of risk factors and measures for prevention of condition will improve 02/02/2020 1056 by Chinita Pester, RN Outcome: Progressing 02/02/2020 1056 by Chinita Pester, RN Outcome: Progressing   Problem: Coping: Goal: Psychosocial and spiritual needs will be supported 02/02/2020 1056 by Chinita Pester, RN Outcome: Progressing 02/02/2020 1056 by Chinita Pester, RN Outcome: Progressing   Problem: Respiratory: Goal: Will maintain a patent airway 02/02/2020 1056 by Chinita Pester, RN Outcome: Progressing 02/02/2020 1056 by Chinita Pester, RN Outcome: Progressing Goal: Complications related to the disease process, condition or treatment will be avoided or minimized 02/02/2020 1056 by Chinita Pester, RN Outcome: Progressing 02/02/2020 1056 by Chinita Pester, RN Outcome: Progressing   Problem: Education: Goal: Knowledge of General Education information will improve Description: Including pain rating scale, medication(s)/side effects and non-pharmacologic comfort measures 02/02/2020 1056 by Chinita Pester, RN Outcome: Progressing 02/02/2020 1056 by Chinita Pester, RN Outcome: Progressing   Problem: Health Behavior/Discharge Planning: Goal: Ability to manage health-related needs will improve 02/02/2020 1056 by Chinita Pester, RN Outcome: Progressing 02/02/2020 1056 by Chinita Pester, RN Outcome: Progressing   Problem: Clinical Measurements: Goal: Ability to maintain clinical measurements within normal limits will improve 02/02/2020 1056 by Chinita Pester, RN Outcome: Progressing 02/02/2020 1056 by Chinita Pester, RN Outcome: Progressing Goal: Will remain free from infection 02/02/2020 1056 by Chinita Pester, RN Outcome: Progressing 02/02/2020 1056 by Chinita Pester, RN Outcome: Progressing Goal: Diagnostic test results will improve 02/02/2020 1056 by Chinita Pester, RN Outcome:  Progressing 02/02/2020 1056 by Chinita Pester, RN Outcome: Progressing Goal: Respiratory complications will improve 02/02/2020 1056 by Chinita Pester, RN Outcome: Progressing 02/02/2020 1056 by Chinita Pester, RN Outcome: Progressing Goal: Cardiovascular complication will be avoided 02/02/2020 1056 by Chinita Pester, RN Outcome: Progressing 02/02/2020 1056 by Chinita Pester, RN Outcome: Progressing   Problem: Activity: Goal: Risk for activity intolerance will decrease 02/02/2020 1056 by Chinita Pester, RN Outcome: Progressing 02/02/2020 1056 by Chinita Pester, RN Outcome: Progressing   Problem: Nutrition: Goal: Adequate nutrition will be maintained 02/02/2020 1056 by Chinita Pester, RN Outcome: Progressing 02/02/2020 1056 by Chinita Pester, RN Outcome: Progressing   Problem: Coping: Goal: Level of anxiety will decrease 02/02/2020 1056 by Chinita Pester, RN Outcome: Progressing 02/02/2020 1056 by Chinita Pester, RN Outcome: Progressing   Problem: Elimination: Goal: Will not experience complications related to bowel motility 02/02/2020 1056 by Chinita Pester, RN Outcome: Progressing 02/02/2020 1056 by Chinita Pester, RN Outcome: Progressing Goal: Will not experience complications related to urinary retention 02/02/2020 1056 by Chinita Pester, RN Outcome: Progressing 02/02/2020 1056 by Chinita Pester, RN Outcome: Progressing   Problem: Pain Managment: Goal: General experience of comfort will improve 02/02/2020 1056 by Chinita Pester, RN Outcome: Progressing 02/02/2020 1056 by Chinita Pester, RN Outcome: Progressing   Problem: Safety: Goal: Ability to remain free from injury will improve 02/02/2020 1056 by Chinita Pester, RN Outcome: Progressing 02/02/2020 1056 by Chinita Pester, RN Outcome: Progressing   Problem: Skin Integrity: Goal: Risk for impaired skin integrity will decrease 02/02/2020 1056 by  Chinita Pester, RN Outcome: Progressing 02/02/2020 1056 by Chinita Pester, RN Outcome: Progressing

## 2020-02-02 NOTE — Progress Notes (Signed)
PROGRESS NOTE    Lawrence Wolf   FHL:[id number]  DOB: 21-Mar-1981  PCP: [facility]    DOA: 01/29/2020 LOS: 4   Brief Narrative   Lawrence Wolf is a 39 y.o. male with medical history of hypertension who was diagnosed with COVID-19 about 8 days prior to presenting to the ED on 01/29/20.   He reported initially symptoms of fever/chills, nausea and vomiting, and since developed shortness of breath, profound fatigue, diarrhea and presyncopal episodes of dizziness / lightheadedness, unable to even walk.    Evaluation in the ED - febrile at 102.6 F, normotensive, HR 101, RR 35, spO2 92% on room air.  Labs notable for mildly elevated LFT's AST 68, ALT 75, normal lactic acid, procal 0.15, mild thrombocytopenia Plts 142k.  Chest xray showed bilateral patchy infiltrates consistent with Covid-19 pneumonia.  Initial CRP 6.7.  Admitted for management of Covid-19 infection with both GI symptoms and multifocal pneumonia.      Assessment & Plan   Active Problems:   Sepsis due to COVID-19 [facility])   Sepsis secondary to Covid-19 Infection with Pneumonia and Gastroenteritis - POA with positive Covid-19 test outpatient, presented with above symptoms, both GI and respiratory.  Not hypoxic on presentation.  Chest xray showing pneumonia.  Met sepsis criteria with tachycardia, tachypnea and known source Covid-19 infection. Acute respiratory failure with hyoxia - due to Covid-19 --Continue remdesivir (10/22 >>) --Continue Solu-medrol 40 mg IV BID --Continue baricitinib (10/23 >>) --Antitussives PRN --Combivent q6h --Vitamin C, D3, zinc --follow blood culture - neg to date --prone or lateral decubitus positioning as tolerated --monitor spO2, use oxygen as needed to keep spO2 at or above 90% --follow inflammatory markers, CMP, CBC   Elevated LFT's - present on admission, AST 68, ALT 75. Abdominal pain - Resolved.  Was present on admission.  Unclear cause, likely related to Covid infection.   Does have mildly elevated LFT's on admission.  RUQ ultrasound showed nothing acute, hepatic steatosis. --Trial of Protonix, also for GI protection on IV steroids --Discussed U/S results with pt, recommend avoiding or moderating alcohol, could potentially be caused by supplements --Outpatient follow up --Daily CMP's, watch closely on remdesivir and baricitinib --Follow CMP's, monitor closely with remdesivir and baricitinib, okay to continue for now   Thrombocytopenia - Resolved.  Present on admission with Plts 142k >> 144k>>189k as of 10/24.   Most likely due to sepsis than liver disease.  PT/INR normal.    DVT prophylaxis: wt-based Lovenox   Diet:  Diet Orders (From admission, onward)    Start     Ordered   02/01/20 0756  Diet regular Room service appropriate? Yes; Fluid consistency: Thin  Diet effective now       Question Answer Comment  Room service appropriate? Yes   Fluid consistency: Thin      02/01/20 0756            Code Status: Full Code    Subjective 02/02/20    Pt seen at bedside this AM.  Reports feeling better.  Appetite improved, no nausea after breakfast today.  Able to take deeper breaths.  Still with productive cough.  Denies fever/chills, abdominal pain, N/V/D.     Disposition Plan & Communication   Status is: Inpatient  Remains inpatient appropriate because:IV treatments appropriate due to intensity of illness or inability to take PO.  Pt continues requiring oxygen, receiving IV therapies as above.   Dispo: The patient is from: Home  Anticipated d/c is to: Home              Anticipated d/c date is: 2 days              Patient currently is not medically stable to d/c.   Family Communication: Wife was updated by phone this afternoon 10/26.   Consults, Procedures, Significant Events   Consultants:   None  Procedures:   None  Antimicrobials:  Anti-infectives (From admission, onward)   Start     Dose/Rate Route Frequency  Ordered Stop   01/30/20 1000  remdesivir 100 mg in sodium chloride 0.9 % 100 mL IVPB       "Followed by" Linked Group Details   100 mg 200 mL/hr over 30 Minutes Intravenous Daily 01/29/20 2205 02/02/20 1012   01/30/20 1000  remdesivir 100 mg in sodium chloride 0.9 % 100 mL IVPB  Status:  Discontinued       "Followed by" Linked Group Details   100 mg 200 mL/hr over 30 Minutes Intravenous Daily 01/29/20 2219 01/29/20 2226   01/29/20 2230  remdesivir 200 mg in sodium chloride 0.9% 250 mL IVPB  Status:  Discontinued       "Followed by" Linked Group Details   200 mg 580 mL/hr over 30 Minutes Intravenous Once 01/29/20 2219 01/29/20 2226   01/29/20 2215  remdesivir 200 mg in sodium chloride 0.9% 250 mL IVPB       "Followed by" Linked Group Details   200 mg 580 mL/hr over 30 Minutes Intravenous Once 01/29/20 2205 01/30/20 0136         Objective   Vitals:   02/02/20 0029 02/02/20 0511 02/02/20 0906 02/02/20 1207  BP: 122/76 120/68 (!) 131/93 131/82  Pulse: 67 70 73 (!) 58  Resp: 18 18 17 14   Temp: 97.7 F (36.5 C) 97.9 F (36.6 C) 98.2 F (36.8 C) 98.3 F (36.8 C)  TempSrc: Oral Oral Oral Oral  SpO2: 94% 94% 93% 90%  Weight:      Height:        Intake/Output Summary (Last 24 hours) at 02/02/2020 1504 Last data filed at 02/02/2020 0900 Gross per 24 hour  Intake 1632.18 ml  Output 1950 ml  Net -317.82 ml   Filed Weights   01/29/20 1814  Weight: 96.2 kg    Physical Exam:  General exam: awake, alert, no acute distress Respiratory system: CATB, improving aeration in bases, inspirations less shallow than previously, no wheezes or rhonchi, normal respiratory effort, on 2 L/min oxygen by nasal cannula. Cardiovascular system: normal S1/S2, RRR, no pedal edema.   Gastrointestinal system: soft, non-tender, not distended Central nervous system: A&O x4. no gross focal neurologic deficits, normal speech   Labs   Data Reviewed: I have personally reviewed following labs and  imaging studies  CBC: Recent Labs  Lab 01/29/20 1825 01/30/20 0530 01/31/20 0453 02/01/20 0611 02/02/20 0627  WBC 5.7 5.8 6.1 6.0 7.3  NEUTROABS 4.7 5.3 4.5 5.1 6.1  HGB 15.9 15.2 14.8 15.4 15.6  HCT 47.4 45.2 44.2 46.9 46.8  MCV 82.3 83.9 83.7 83.9 83.3  PLT 142* 144* 189 214 423   Basic Metabolic Panel: Recent Labs  Lab 01/29/20 1825 01/30/20 0530 01/31/20 0453 02/01/20 0611 02/02/20 0627  NA 137 138 139 139 136  K 4.0 4.6 4.6 4.9 5.0  CL 101 103 104 102 101  CO2 24 26 27 27 26   GLUCOSE 114* 181* 102* 157* 201*  BUN 14 13 19 18  19  CREATININE 1.22 1.28* 1.33* 1.19 1.13  CALCIUM 8.7* 8.4* 8.6* 9.0 9.0  MG  --  2.4  --   --   --   PHOS  --  4.3  --   --   --    GFR: Estimated Creatinine Clearance: 104.2 mL/min (by C-G formula based on SCr of 1.13 mg/dL). Liver Function Tests: Recent Labs  Lab 01/29/20 1825 01/30/20 0530 01/31/20 0453 02/01/20 0611 02/02/20 0627  AST 68* 72* 73* 79* 72*  ALT 75* 75* 91* 120* 135*  ALKPHOS 74 76 70 76 76  BILITOT 0.7 0.6 0.4 0.7 0.5  PROT 7.8 7.4 6.9 7.3 6.9  ALBUMIN 4.0 3.6 3.3* 3.4* 3.3*   No results for input(s): LIPASE, AMYLASE in the last 168 hours. No results for input(s): AMMONIA in the last 168 hours. Coagulation Profile: Recent Labs  Lab 01/31/20 0453  INR 0.9   Cardiac Enzymes: No results for input(s): CKTOTAL, CKMB, CKMBINDEX, TROPONINI in the last 168 hours. BNP (last 3 results) No results for input(s): PROBNP in the last 8760 hours. HbA1C: No results for input(s): HGBA1C in the last 72 hours. CBG: No results for input(s): GLUCAP in the last 168 hours. Lipid Profile: No results for input(s): CHOL, HDL, LDLCALC, TRIG, CHOLHDL, LDLDIRECT in the last 72 hours. Thyroid Function Tests: No results for input(s): TSH, T4TOTAL, FREET4, T3FREE, THYROIDAB in the last 72 hours. Anemia Panel: Recent Labs    02/01/20 0611 02/02/20 0627  FERRITIN 955* 968*   Sepsis Labs: Recent Labs  Lab 01/29/20 1825  01/29/20 2057 01/29/20 2125  PROCALCITON  --   --  0.15  LATICACIDVEN 1.0 1.6  --     Recent Results (from the past 240 hour(s))  Blood culture (single)     Status: None (Preliminary result)   Collection Time: 01/29/20  9:28 PM   Specimen: BLOOD  Result Value Ref Range Status   Specimen Description BLOOD LEFT ANTECUBITAL  Final   Special Requests   Final    BOTTLES DRAWN AEROBIC AND ANAEROBIC Blood Culture adequate volume   Culture   Final    NO GROWTH 4 DAYS Performed at [facility], [address], [place], [place] [address]    Report Status PENDING  Incomplete      Imaging Studies   US Abdomen Limited RUQ (LIVER/GB)  Result Date: 02/01/2020 CLINICAL DATA:  Elevated liver function tests. EXAM: ULTRASOUND ABDOMEN LIMITED RIGHT UPPER QUADRANT COMPARISON:  None. FINDINGS: Gallbladder: No gallstones or wall thickening visualized. No sonographic Murphy sign noted by sonographer. Common bile duct: Diameter: 3 mm which is within normal limits. Liver: No focal lesion identified. Increased echogenicity of hepatic parenchyma is noted suggesting hepatic steatosis or other diffuse hepatocellular disease. Portal vein is patent on color Doppler imaging with normal direction of blood flow towards the liver. Other: None. IMPRESSION: Increased echogenicity of hepatic parenchyma is noted suggesting hepatic steatosis or other diffuse hepatocellular disease. No other abnormality is noted in the right upper quadrant of the abdomen. Electronically Signed   By: Marijo Conception M.D.   On: 02/01/2020 08:08     Medications   Scheduled Meds: . vitamin C  500 mg Oral Daily  . baricitinib  4 mg Oral Daily  . cholecalciferol  1,000 Units Oral Daily  . enoxaparin (LOVENOX) injection  0.5 mg/kg Subcutaneous Q24H  . feeding supplement  237 mL Oral TID BM  . Ipratropium-Albuterol  1 puff Inhalation Q6H  . methylPREDNISolone (SOLU-MEDROL) injection  40 mg  Intravenous BID  . pantoprazole  40 mg  Oral Daily  . polyethylene glycol  17 g Oral Daily  . zinc sulfate  220 mg Oral Daily   Continuous Infusions:      LOS: 4 days    Time spent: 25 minutes with > 50% spent in coordination of care and direct patient contact.    Ezekiel Slocumb, DO [facility]  02/02/2020, 3:04 PM    If 7PM-7AM, please contact night-coverage. How to contact the [facility] Attending or Consulting provider [place] or covering provider during after hours [place], for this patient?    1. Check the care team in [facility] and look for a) attending/consulting TRH provider listed and b) the [facility] team listed 2. Log into [email or web address] and use [facility]'s universal password to access. If you do not have the password, please contact the hospital operator. 3. Locate the [facility] provider you are looking for under [facility] and page to a number that you can be directly reached. 4. If you still have difficulty reaching the provider, please page the [facility] (Director on Call) for the Hospitalists listed on amion for assistance.

## 2020-02-03 ENCOUNTER — Other Ambulatory Visit: Payer: Self-pay | Admitting: Internal Medicine

## 2020-02-03 DIAGNOSIS — J1282 Pneumonia due to coronavirus disease 2019: Secondary | ICD-10-CM | POA: Diagnosis not present

## 2020-02-03 DIAGNOSIS — U071 COVID-19: Secondary | ICD-10-CM | POA: Diagnosis not present

## 2020-02-03 LAB — CBC WITH DIFFERENTIAL/PLATELET
Abs Immature Granulocytes: 0.21 10*3/uL — ABNORMAL HIGH (ref 0.00–0.07)
Basophils Absolute: 0 10*3/uL (ref 0.0–0.1)
Basophils Relative: 0 %
Eosinophils Absolute: 0 10*3/uL (ref 0.0–0.5)
Eosinophils Relative: 0 %
HCT: 47.2 % (ref 39.0–52.0)
Hemoglobin: 15.5 g/dL (ref 13.0–17.0)
Immature Granulocytes: 2 %
Lymphocytes Relative: 8 %
Lymphs Abs: 0.9 10*3/uL (ref 0.7–4.0)
MCH: 27.6 pg (ref 26.0–34.0)
MCHC: 32.8 g/dL (ref 30.0–36.0)
MCV: 84 fL (ref 80.0–100.0)
Monocytes Absolute: 0.4 10*3/uL (ref 0.1–1.0)
Monocytes Relative: 4 %
Neutro Abs: 8.9 10*3/uL — ABNORMAL HIGH (ref 1.7–7.7)
Neutrophils Relative %: 86 %
Platelets: 324 10*3/uL (ref 150–400)
RBC: 5.62 MIL/uL (ref 4.22–5.81)
RDW: 14.1 % (ref 11.5–15.5)
WBC: 10.4 10*3/uL (ref 4.0–10.5)
nRBC: 0 % (ref 0.0–0.2)

## 2020-02-03 LAB — COMPREHENSIVE METABOLIC PANEL
ALT: 151 U/L — ABNORMAL HIGH (ref 0–44)
AST: 92 U/L — ABNORMAL HIGH (ref 15–41)
Albumin: 3.7 g/dL (ref 3.5–5.0)
Alkaline Phosphatase: 74 U/L (ref 38–126)
Anion gap: 10 (ref 5–15)
BUN: 22 mg/dL — ABNORMAL HIGH (ref 6–20)
CO2: 27 mmol/L (ref 22–32)
Calcium: 9 mg/dL (ref 8.9–10.3)
Chloride: 99 mmol/L (ref 98–111)
Creatinine, Ser: 1.08 mg/dL (ref 0.61–1.24)
GFR, Estimated: >60 mL/min (ref >60)
Glucose, Bld: 203 mg/dL — ABNORMAL HIGH (ref 70–99)
Potassium: 5.2 mmol/L — ABNORMAL HIGH (ref 3.5–5.1)
Sodium: 136 mmol/L (ref 135–145)
Total Bilirubin: 0.9 mg/dL (ref 0.3–1.2)
Total Protein: 7.2 g/dL (ref 6.5–8.1)

## 2020-02-03 LAB — CULTURE, BLOOD (SINGLE): Culture: NO GROWTH

## 2020-02-03 LAB — FIBRIN DERIVATIVES D-DIMER (ARMC ONLY): Fibrin derivatives D-dimer (ARMC): 483.5 ng/mL (FEU) (ref 0.00–499.00)

## 2020-02-03 LAB — FERRITIN: Ferritin: 962 ng/mL — ABNORMAL HIGH (ref 24–336)

## 2020-02-03 LAB — POTASSIUM: Potassium: 4.9 mmol/L (ref 3.5–5.1)

## 2020-02-03 LAB — C-REACTIVE PROTEIN: CRP: 1.3 mg/dL — ABNORMAL HIGH (ref <1.0)

## 2020-02-03 MED ORDER — ALBUTEROL SULFATE HFA 108 (90 BASE) MCG/ACT IN AERS: 2.0000 | INHALATION_SPRAY | Freq: Four times a day (QID) | RESPIRATORY_TRACT | 0 refills | Status: DC | PRN

## 2020-02-03 MED ORDER — PREDNISONE 20 MG PO TABS: ORAL_TABLET | ORAL | 0 refills | Status: DC

## 2020-02-03 MED ORDER — POLYETHYLENE GLYCOL 3350 17 G PO PACK
17.0000 g | PACK | Freq: Every day | ORAL | 0 refills | Status: DC | PRN
Start: 2020-02-03 — End: 2020-02-03

## 2020-02-03 MED ORDER — BENZONATATE 100 MG PO CAPS: 100.0000 mg | ORAL_CAPSULE | Freq: Three times a day (TID) | ORAL | 0 refills | Status: DC | PRN

## 2020-02-03 MED ORDER — IPRATROPIUM-ALBUTEROL 20-100 MCG/ACT IN AERS: 1.0000 | INHALATION_SPRAY | Freq: Four times a day (QID) | RESPIRATORY_TRACT | 0 refills | Status: DC | PRN

## 2020-02-03 MED ORDER — PANTOPRAZOLE SODIUM 40 MG PO TBEC: 40.0000 mg | DELAYED_RELEASE_TABLET | Freq: Every day | ORAL | 0 refills | Status: DC

## 2020-02-03 NOTE — Progress Notes (Signed)
SATURATION QUALIFICATIONS: (This note is used to comply with regulatory documentation for home oxygen)  Patient Saturations on Room Air at Rest = 91%  Patient Saturations on Room Air while Ambulating = 91%  Patient Saturations on 0 Liters of oxygen while Ambulating = 91%  Please briefly explain why patient needs home oxygen:

## 2020-02-03 NOTE — TOC Transition Note (Signed)
Transition of Care [facility]) - CM/SW Discharge Note   Patient Details  Name: Lawrence Wolf MRN: [id number] Date of Birth: [birth year]  [facility] of [facility]) CM/SW Contact:  Allayne Butcher, RN Phone Number: 02/03/2020, 1:32 PM   Clinical Narrative:    Patient is medically ready for discharge home.  Patient is current with his PCP.  Patient does not currently have any insurance so discharge medications will be sent to Medication Management, application for Medication Management given to patient.  Patient's wife will be able to pick up his prescriptions at discharge.    Final next level of care: Home/Self Care Barriers to Discharge: Barriers Resolved   Patient Goals and CMS Choice Patient states their goals for this hospitalization and ongoing recovery are:: Glad to be going home today      Discharge Placement                       Discharge Plan and Services   Discharge Planning Services: CM Consult, Medication Assistance              DME Agency: NA                  Social Determinants of Health (SDOH) Interventions     Readmission Risk Interventions No flowsheet data found.

## 2020-02-03 NOTE — Discharge Instructions (Signed)
COVID-19 COVID-19 is a respiratory infection that is caused by a virus called severe acute respiratory syndrome coronavirus 2 (SARS-CoV-2). The disease is also known as coronavirus disease or novel coronavirus. In some people, the virus may not cause any symptoms. In others, it may cause a serious infection. The infection can get worse quickly and can lead to complications, such as:  Pneumonia, or infection of the lungs.  Acute respiratory distress syndrome or ARDS. This is a condition in which fluid build-up in the lungs prevents the lungs from filling with air and passing oxygen into the blood.  Acute respiratory failure. This is a condition in which there is not enough oxygen passing from the lungs to the body or when carbon dioxide is not passing from the lungs out of the body.  Sepsis or septic shock. This is a serious bodily reaction to an infection.  Blood clotting problems.  Secondary infections due to bacteria or fungus.  Organ failure. This is when your body's organs stop working. The virus that causes COVID-19 is contagious. This means that it can spread from person to person through droplets from coughs and sneezes (respiratory secretions). What are the causes? This illness is caused by a virus. You may catch the virus by:  Breathing in droplets from an infected person. Droplets can be spread by a person breathing, speaking, singing, coughing, or sneezing.  Touching something, like a table or a doorknob, that was exposed to the virus (contaminated) and then touching your mouth, nose, or eyes. What increases the risk? Risk for infection You are more likely to be infected with this virus if you:  Are within 6 feet (2 meters) of a person with COVID-19.  Provide care for or live with a person who is infected with COVID-19.  Spend time in crowded indoor spaces or live in shared housing. Risk for serious illness You are more likely to become seriously ill from the virus if you:   Are 50 years of age or older. The higher your age, the more you are at risk for serious illness.  Live in a nursing home or long-term care facility.  Have cancer.  Have a long-term (chronic) disease such as: ? Chronic lung disease, including chronic obstructive pulmonary disease or asthma. ? A long-term disease that lowers your body's ability to fight infection (immunocompromised). ? Heart disease, including heart failure, a condition in which the arteries that lead to the heart become narrow or blocked (coronary artery disease), a disease which makes the heart muscle thick, weak, or stiff (cardiomyopathy). ? Diabetes. ? Chronic kidney disease. ? Sickle cell disease, a condition in which red blood cells have an abnormal "sickle" shape. ? Liver disease.  Are obese. What are the signs or symptoms? Symptoms of this condition can range from mild to severe. Symptoms may appear any time from 2 to 14 days after being exposed to the virus. They include:  A fever or chills.  A cough.  Difficulty breathing.  Headaches, body aches, or muscle aches.  Runny or stuffy (congested) nose.  A sore throat.  New loss of taste or smell. Some people may also have stomach problems, such as nausea, vomiting, or diarrhea. Other people may not have any symptoms of COVID-19. How is this diagnosed? This condition may be diagnosed based on:  Your signs and symptoms, especially if: ? You live in an area with a COVID-19 outbreak. ? You recently traveled to or from an area where the virus is common. ? You   provide care for or live with a person who was diagnosed with COVID-19. ? You were exposed to a person who was diagnosed with COVID-19.  A physical exam.  Lab tests, which may include: ? Taking a sample of fluid from the back of your nose and throat (nasopharyngeal fluid), your nose, or your throat using a swab. ? A sample of mucus from your lungs (sputum). ? Blood tests.  Imaging tests, which  may include, X-rays, CT scan, or ultrasound. How is this treated? At present, there is no medicine to treat COVID-19. Medicines that treat other diseases are being used on a trial basis to see if they are effective against COVID-19. Your health care provider will talk with you about ways to treat your symptoms. For most people, the infection is mild and can be managed at home with rest, fluids, and over-the-counter medicines. Treatment for a serious infection usually takes places in a hospital intensive care unit (ICU). It may include one or more of the following treatments. These treatments are given until your symptoms improve.  Receiving fluids and medicines through an IV.  Supplemental oxygen. Extra oxygen is given through a tube in the nose, a face mask, or a hood.  Positioning you to lie on your stomach (prone position). This makes it easier for oxygen to get into the lungs.  Continuous positive airway pressure (CPAP) or bi-level positive airway pressure (BPAP) machine. This treatment uses mild air pressure to keep the airways open. A tube that is connected to a motor delivers oxygen to the body.  Ventilator. This treatment moves air into and out of the lungs by using a tube that is placed in your windpipe.  Tracheostomy. This is a procedure to create a hole in the neck so that a breathing tube can be inserted.  Extracorporeal membrane oxygenation (ECMO). This procedure gives the lungs a chance to recover by taking over the functions of the heart and lungs. It supplies oxygen to the body and removes carbon dioxide. Follow these instructions at home: Lifestyle  If you are sick, stay home except to get medical care. Your health care provider will tell you how long to stay home. Call your health care provider before you go for medical care.  Rest at home as told by your health care provider.  Do not use any products that contain nicotine or tobacco, such as cigarettes, e-cigarettes, and  chewing tobacco. If you need help quitting, ask your health care provider.  Return to your normal activities as told by your health care provider. Ask your health care provider what activities are safe for you. General instructions  Take over-the-counter and prescription medicines only as told by your health care provider.  Drink enough fluid to keep your urine pale yellow.  Keep all follow-up visits as told by your health care provider. This is important. How is this prevented?  There is no vaccine to help prevent COVID-19 infection. However, there are steps you can take to protect yourself and others from this virus. To protect yourself:   Do not travel to areas where COVID-19 is a risk. The areas where COVID-19 is reported change often. To identify high-risk areas and travel restrictions, check the CDC travel website: wwwnc.cdc.gov/travel/notices  If you live in, or must travel to, an area where COVID-19 is a risk, take precautions to avoid infection. ? Stay away from people who are sick. ? Wash your hands often with soap and water for 20 seconds. If soap and water   are not available, use an alcohol-based hand sanitizer. ? Avoid touching your mouth, face, eyes, or nose. ? Avoid going out in public, follow guidance from your state and local health authorities. ? If you must go out in public, wear a cloth face covering or face mask. Make sure your mask covers your nose and mouth. ? Avoid crowded indoor spaces. Stay at least 6 feet (2 meters) away from others. ? Disinfect objects and surfaces that are frequently touched every day. This may include:  Counters and tables.  Doorknobs and light switches.  Sinks and faucets.  Electronics, such as phones, remote controls, keyboards, computers, and tablets. To protect others: If you have symptoms of COVID-19, take steps to prevent the virus from spreading to others.  If you think you have a COVID-19 infection, contact your health care  provider right away. Tell your health care team that you think you may have a COVID-19 infection.  Stay home. Leave your house only to seek medical care. Do not use public transport.  Do not travel while you are sick.  Wash your hands often with soap and water for 20 seconds. If soap and water are not available, use alcohol-based hand sanitizer.  Stay away from other members of your household. Let healthy household members care for children and pets, if possible. If you have to care for children or pets, wash your hands often and wear a mask. If possible, stay in your own room, separate from others. Use a different bathroom.  Make sure that all people in your household wash their hands well and often.  Cough or sneeze into a tissue or your sleeve or elbow. Do not cough or sneeze into your hand or into the air.  Wear a cloth face covering or face mask. Make sure your mask covers your nose and mouth. Where to find more information  Centers for Disease Control and Prevention: www.cdc.gov/coronavirus/2019-ncov/index.html  World Health Organization: www.who.int/health-topics/coronavirus Contact a health care provider if:  You live in or have traveled to an area where COVID-19 is a risk and you have symptoms of the infection.  You have had contact with someone who has COVID-19 and you have symptoms of the infection. Get help right away if:  You have trouble breathing.  You have pain or pressure in your chest.  You have confusion.  You have bluish lips and fingernails.  You have difficulty waking from sleep.  You have symptoms that get worse. These symptoms may represent a serious problem that is an emergency. Do not wait to see if the symptoms will go away. Get medical help right away. Call your local emergency services (911 in the [place] Do not drive yourself to the hospital. Let the emergency medical personnel know if you think you have COVID-19. Summary  COVID-19 is a  respiratory infection that is caused by a virus. It is also known as coronavirus disease or novel coronavirus. It can cause serious infections, such as pneumonia, acute respiratory distress syndrome, acute respiratory failure, or sepsis.  The virus that causes COVID-19 is contagious. This means that it can spread from person to person through droplets from breathing, speaking, singing, coughing, or sneezing.  You are more likely to develop a serious illness if you are 50 years of age or older, have a weak immune system, live in a nursing home, or have chronic disease.  There is no medicine to treat COVID-19. Your health care provider will talk with you about ways to treat your symptoms.    Take steps to protect yourself and others from infection. Wash your hands often and disinfect objects and surfaces that are frequently touched every day. Stay away from people who are sick and wear a mask if you are sick. This information is not intended to replace advice given to you by your health care provider. Make sure you discuss any questions you have with your health care provider. Document Revised: 01/23/2019 Document Reviewed: 05/01/2018 Elsevier Patient Education  2020 [employer]  

## 2020-02-03 NOTE — Discharge Summary (Signed)
[facility]  Physician Discharge Summary   Patient ID: Lawrence Wolf MRN: [id number] DOB/AGE: [birth year] 39 y.o.  Admit date: 01/29/2020 Discharge date: 02/03/2020  PCP: [facility]  DISCHARGE DIAGNOSES:  Pneumonia due to COVID-19 Acute respiratory failure with hypoxia, resolved Sepsis, present on admission Transaminitis  RECOMMENDATIONS FOR OUTPATIENT FOLLOW UP: 1. Outpatient follow-up with PCP in 1 week    Home Health: None Equipment/Devices: None  CODE STATUS: Full code  DISCHARGE CONDITION: fair  Diet recommendation: Regular as tolerated  INITIAL HISTORY: Lawrence Raynoris a 39 y.o.malewith medical history ofhypertension who was diagnosed with COVID-19 about 8 days prior to presenting to the ED on 01/29/20.   He reported initially symptoms of fever/chills, nausea and vomiting, and since developed shortness of breath, profound fatigue, diarrhea and presyncopal episodes of dizziness / lightheadedness, unable to even walk.    Evaluation in the ED - febrile at 102.6 F, normotensive, HR 101, RR 35, spO2 92% on room air.  Labs notable for mildly elevated LFT's AST 68, ALT 75, normal lactic acid, procal 0.15, mild thrombocytopenia Plts 142k.  Chest xray showed bilateral patchy infiltrates consistent with Covid-19 pneumonia.  Initial CRP 6.7.  Admitted for management of Covid-19 infection with both GI symptoms and multifocal pneumonia   HOSPITAL COURSE:   Pneumonia due to COVID-19/acute respiratory failure with hypoxia/acute gastroenteritis/sepsis present on admission  Presented with GI and respiratory symptoms.  Was not initially hypoxic on presentation however subsequently started requiring oxygen.  Chest x-ray did show pneumonia. Met sepsis criteria with tachycardia, tachypnea and known source Covid-19 infection. Patient was treated with the Remdesivir steroids and baricitinib.  He was given antitussive agents.  Inflammatory markers were checked.   Patient started improving.  He is now off of oxygen.  He was ambulated.  Saturations remained in the 90s.  Patient is feeling better.  Okay for discharge home today.  He will be discharged on tapering doses of steroids.  Transaminitis Likely due to combination of COVID-19 infection as well as the use of Remdesivir and baricitinib.  LFTs are stable.  Recommended to have these rechecked in the outpatient setting.  Ultrasound of the abdomen did not show any acute findings.  Thrombocytopenia Resolved.    Obesity Estimated body mass index is 30.42 kg/m as calculated from the following:   Height as of this encounter: 5' 10"  (1.778 m).   Weight as of this encounter: 96.2 kg.  Overall patient remained stable.  Feels better this morning.  Okay for discharge home today.   PERTINENT LABS:  The results of significant diagnostics from this hospitalization (including imaging, microbiology, ancillary and laboratory) are listed below for reference.    Microbiology: Recent Results (from the past 240 hour(s))  Blood culture (single)     Status: None   Collection Time: 01/29/20  9:28 PM   Specimen: BLOOD  Result Value Ref Range Status   Specimen Description BLOOD LEFT ANTECUBITAL  Final   Special Requests   Final    BOTTLES DRAWN AEROBIC AND ANAEROBIC Blood Culture adequate volume   Culture   Final    NO GROWTH 5 DAYS Performed at [facility], [address] [place], [place] [address]    Report Status 02/03/2020 FINAL  Final     Labs:  COVID-19 Labs  Recent Labs    02/01/20 0611 02/02/20 0627 02/03/20 0447  FERRITIN 955* 968* 962*  CRP 5.2* 2.5* 1.3*     Basic Metabolic Panel: Recent Labs  Lab 01/30/20 0530 01/30/20  0530 01/31/20 0453 02/01/20 0611 02/02/20 0627 02/03/20 0447 02/03/20 1209  NA 138  --  139 139 136 136  --   K 4.6   < > 4.6 4.9 5.0 5.2* 4.9  CL 103  --  104 102 101 99  --   CO2 26  --  27 27 26 27   --   GLUCOSE 181*  --  102* 157* 201* 203*   --   BUN 13  --  19 18 19  22*  --   CREATININE 1.28*  --  1.33* 1.19 1.13 1.08  --   CALCIUM 8.4*  --  8.6* 9.0 9.0 9.0  --   MG 2.4  --   --   --   --   --   --   PHOS 4.3  --   --   --   --   --   --    < > = values in this interval not displayed.   Liver Function Tests: Recent Labs  Lab 01/30/20 0530 01/31/20 0453 02/01/20 0611 02/02/20 0627 02/03/20 0447  AST 72* 73* 79* 72* 92*  ALT 75* 91* 120* 135* 151*  ALKPHOS 76 70 76 76 74  BILITOT 0.6 0.4 0.7 0.5 0.9  PROT 7.4 6.9 7.3 6.9 7.2  ALBUMIN 3.6 3.3* 3.4* 3.3* 3.7   CBC: Recent Labs  Lab 01/30/20 0530 01/31/20 0453 02/01/20 0611 02/02/20 0627 02/03/20 0447  WBC 5.8 6.1 6.0 7.3 10.4  NEUTROABS 5.3 4.5 5.1 6.1 8.9*  HGB 15.2 14.8 15.4 15.6 15.5  HCT 45.2 44.2 46.9 46.8 47.2  MCV 83.9 83.7 83.9 83.3 84.0  PLT 144* 189 214 266 324   IMAGING STUDIES DG Chest 2 View  Result Date: 01/29/2020 CLINICAL DATA:  Shortness of breath and COVID-19 positivity EXAM: CHEST - 2 VIEW COMPARISON:  None. FINDINGS: Cardiac shadow is within normal limits. Scattered airspace opacities are noted throughout both lungs consistent with the given clinical history. No sizable effusion or pneumothorax is noted. No bony abnormality is seen. IMPRESSION: Patchy airspace opacities consistent with the given clinical history. Electronically Signed   By: Inez Catalina M.D.   On: 01/29/2020 18:53   US Abdomen Limited RUQ (LIVER/GB)  Result Date: 02/01/2020 CLINICAL DATA:  Elevated liver function tests. EXAM: ULTRASOUND ABDOMEN LIMITED RIGHT UPPER QUADRANT COMPARISON:  None. FINDINGS: Gallbladder: No gallstones or wall thickening visualized. No sonographic Murphy sign noted by sonographer. Common bile duct: Diameter: 3 mm which is within normal limits. Liver: No focal lesion identified. Increased echogenicity of hepatic parenchyma is noted suggesting hepatic steatosis or other diffuse hepatocellular disease. Portal vein is patent on color Doppler imaging  with normal direction of blood flow towards the liver. Other: None. IMPRESSION: Increased echogenicity of hepatic parenchyma is noted suggesting hepatic steatosis or other diffuse hepatocellular disease. No other abnormality is noted in the right upper quadrant of the abdomen. Electronically Signed   By: Marijo Conception M.D.   On: 02/01/2020 08:08    DISCHARGE EXAMINATION: Vitals:   02/03/20 0003 02/03/20 0506 02/03/20 0752 02/03/20 1151  BP: 117/67 119/67 109/69 (!) 140/91  Pulse: (!) 55 63 (!) 55 (!) 53  Resp: 16 16 (!) 23 18  Temp: 98 F (36.7 C) 98.1 F (36.7 C) 97.9 F (36.6 C) 98.4 F (36.9 C)  TempSrc: Oral Oral Oral Oral  SpO2: 99% 93% 96% 96%  Weight:      Height:       General appearance: Awake alert.  In no distress Resp: Normal effort at rest.  Few crackles at the bases bilaterally.  No wheezing or rhonchi. Cardio: S1-S2 is normal regular.  No S3-S4.  No rubs murmurs or bruit GI: Abdomen is soft.  Nontender nondistended.  Bowel sounds are present normal.  No masses organomegaly     DISPOSITION: Home  Discharge Instructions    Call MD for:  difficulty breathing, headache or visual disturbances   Complete by: As directed    Call MD for:  extreme fatigue   Complete by: As directed    Call MD for:  persistant dizziness or light-headedness   Complete by: As directed    Call MD for:  persistant nausea and vomiting   Complete by: As directed    Call MD for:  severe uncontrolled pain   Complete by: As directed    Call MD for:  temperature >100.4   Complete by: As directed    Diet general   Complete by: As directed    Discharge instructions   Complete by: As directed    You need to remain isolated till November 4.  Take your medications as prescribed.  Your glucose levels will improve as steroid is tapered down.  You need to follow-up with your PCP to have your glucose levels rechecked in a few weeks.  They will also need to check your liver function tests at  follow-up.  COVID 19 INSTRUCTIONS  - You are felt to be stable enough to no longer require inpatient monitoring, testing, and treatment, though you will need to follow the recommendations below: - Based on the CDC's non-test criteria for ending self-isolation: You may not return to work/leave the home until at least 20 days since symptom onset AND 24 hours without a fever (without taking tylenol, ibuprofen, etc.) AND have improvement in respiratory symptoms. - Do not take NSAID medications (including, but not limited to, ibuprofen, advil, motrin, naproxen, aleve, goody's powder, etc.) - Follow up with your doctor in the next week via telehealth or seek medical attention right away if your symptoms get WORSE.    Directions for you at home:  Wear a facemask You should wear a facemask that covers your nose and mouth when you are in the same room with other people and when you visit a healthcare provider. People who live with or visit you should also wear a facemask while they are in the same room with you.  Separate yourself from other people in your home As much as possible, you should stay in a different room from other people in your home. Also, you should use a separate bathroom, if available.  Avoid sharing household items You should not share dishes, drinking glasses, cups, eating utensils, towels, bedding, or other items with other people in your home. After using these items, you should wash them thoroughly with soap and water.  Cover your coughs and sneezes Cover your mouth and nose with a tissue when you cough or sneeze, or you can cough or sneeze into your sleeve. Throw used tissues in a lined trash can, and immediately wash your hands with soap and water for at least 20 seconds or use an alcohol-based hand rub.  Wash your [facility] your hands often and thoroughly with soap and water for at least 20 seconds. You can use an alcohol-based hand sanitizer if soap and water are  not available and if your hands are not visibly dirty. Avoid touching your eyes, nose, and mouth with unwashed hands.  Directions for those who live with, or provide care at home for you:  Limit the number of people who have contact with the patient If possible, have only one caregiver for the patient. Other household members should stay in another home or place of residence. If this is not possible, they should stay in another room, or be separated from the patient as much as possible. Use a separate bathroom, if available. Restrict visitors who do not have an essential need to be in the home.  Ensure good ventilation Make sure that shared spaces in the home have good air flow, such as from an air conditioner or an opened window, weather permitting.  Wash your hands often Wash your hands often and thoroughly with soap and water for at least 20 seconds. You can use an alcohol based hand sanitizer if soap and water are not available and if your hands are not visibly dirty. Avoid touching your eyes, nose, and mouth with unwashed hands. Use disposable paper towels to dry your hands. If not available, use dedicated cloth towels and replace them when they become wet.  Wear a facemask and gloves Wear a disposable facemask at all times in the room and gloves when you touch or have contact with the patient's blood, body fluids, and/or secretions or excretions, such as sweat, saliva, sputum, nasal mucus, vomit, urine, or feces.  Ensure the mask fits over your nose and mouth tightly, and do not touch it during use. Throw out disposable facemasks and gloves after using them. Do not reuse. Wash your hands immediately after removing your facemask and gloves. If your personal clothing becomes contaminated, carefully remove clothing and launder. Wash your hands after handling contaminated clothing. Place all used disposable facemasks, gloves, and other waste in a lined container before disposing them with  other household waste. Remove gloves and wash your hands immediately after handling these items.  Do not share dishes, glasses, or other household items with the patient Avoid sharing household items. You should not share dishes, drinking glasses, cups, eating utensils, towels, bedding, or other items with a patient who is confirmed to have, or being evaluated for, COVID-19 infection. After the person uses these items, you should wash them thoroughly with soap and water.  Wash laundry thoroughly Immediately remove and wash clothes or bedding that have blood, body fluids, and/or secretions or excretions, such as sweat, saliva, sputum, nasal mucus, vomit, urine, or feces, on them. Wear gloves when handling laundry from the patient. Read and follow directions on labels of laundry or clothing items and detergent. In general, wash and dry with the warmest temperatures recommended on the label.  Clean all areas the individual has used often Clean all touchable surfaces, such as counters, tabletops, doorknobs, bathroom fixtures, toilets, phones, keyboards, tablets, and bedside tables, every day. Also, clean any surfaces that may have blood, body fluids, and/or secretions or excretions on them. Wear gloves when cleaning surfaces the patient has come in contact with. Use a diluted bleach solution (e.g., dilute bleach with 1 part bleach and 10 parts water) or a household disinfectant with a label that says EPA-registered for coronaviruses. To make a bleach solution at home, add 1 tablespoon of bleach to 1 quart (4 cups) of water. For a larger supply, add  cup of bleach to 1 gallon (16 cups) of water. Read labels of cleaning products and follow recommendations provided on product labels. Labels contain instructions for safe and effective use of the cleaning product including precautions you  should take when applying the product, such as wearing gloves or eye protection and making sure you have good ventilation  during use of the product. Remove gloves and wash hands immediately after cleaning.  Monitor yourself for signs and symptoms of illness Caregivers and household members are considered close contacts, should monitor their health, and will be asked to limit movement outside of the home to the extent possible. Follow the monitoring steps for close contacts listed on the symptom monitoring form.   If you have additional questions, contact your local health department or call the epidemiologist on call at [phone] (available 24/7). This guidance is subject to change. For the most up-to-date guidance from [facility], please refer to their website: YouBlogs.pl   You were cared for by a hospitalist during your hospital stay. If you have any questions about your discharge medications or the care you received while you were in the hospital after you are discharged, you can call the unit and asked to speak with the hospitalist on call if the hospitalist that took care of you is not available. Once you are discharged, your primary care physician will handle any further medical issues. Please note that NO REFILLS for any discharge medications will be authorized once you are discharged, as it is imperative that you return to your primary care physician (or establish a relationship with a primary care physician if you do not have one) for your aftercare needs so that they can reassess your need for medications and monitor your lab values. If you do not have a primary care physician, you can call [phone] for a physician referral.   Increase activity slowly   Complete by: As directed        Allergies as of 02/03/2020   No Known Allergies     Medication List    TAKE these medications   albuterol 108 (90 Base) MCG/ACT inhaler Commonly known as: VENTOLIN HFA Inhale 2 puffs into the lungs every 6 (six) hours as needed for wheezing or shortness of  breath.   benzonatate 100 MG capsule Commonly known as: Tessalon Perles Take 1 capsule (100 mg total) by mouth 3 (three) times daily as needed for cough.   cyanocobalamin 1000 MCG/ML injection Commonly known as: (VITAMIN B-12) Inject 1,000 mcg into the muscle every 30 (thirty) days.   pantoprazole 40 MG tablet Commonly known as: PROTONIX Take 1 tablet (40 mg total) by mouth daily for 14 days. Start taking on: February 04, 2020   polyethylene glycol 17 g packet Commonly known as: MIRALAX / GLYCOLAX Take 17 g by mouth daily as needed.   predniSONE 20 MG tablet Commonly known as: DELTASONE Take 3 tablets once daily for 3 days followed by 2 tablets once daily for 3 days followed by 1 tablet once daily for 3 days and then stop         Follow-up Information    [facility] Schedule an appointment as soon as possible for a visit in 1 week.   Specialty: [facility] Contact information: [address] [place] [address] [phone]               TOTAL DISCHARGE TIME: 35 minutes  [facility] Pager on [email or web address]  02/03/2020, 6:40 PM

## 2020-02-03 NOTE — Progress Notes (Signed)
Pt d/c to home via wife. IV removed intact. VSS. Education completed. All questions answered.

## 2020-04-04 ENCOUNTER — Telehealth: Payer: Self-pay | Admitting: Pharmacy Technician

## 2020-04-04 NOTE — Telephone Encounter (Signed)
Patient failed to provide 2021 proof of income.  No additional medication assistance will be provided by [facility] without the required proof of income documentation.  Patient notified by letter.  Sherilyn Dacosta Care Manager [facility]   [address], [place]  [address]  April 04, 2020    Lawrence Wolf [address] [place], [place]  [address]  Dear Lawrence Wolf:  This is to inform you that you are no longer eligible to receive medication assistance at [facility].  The reason(s) are:    _____Your total gross monthly household income exceeds 250% of the Federal Poverty Level.   _____Tangible assets (savings, checking, stocks/bonds, pension, retirement, etc.) exceeds our limit  _____You are eligible to receive benefits from [facility], [facility] or HIV Medication              Assistance Program _____You are eligible to receive benefits from a Medicare Part D plan _____You have prescription insurance  _____You are not an [facility] resident __X__Failure to provide all requested proof of income information for 2021.    Medication assistance will resume once all requested financial information has been returned to our clinic.  If you have questions, please contact our clinic at [phone].    Thank you,  [facility]

## 2021-07-26 IMAGING — US US ABDOMEN LIMITED
1 series · 14 of 25 positions shown · non-contrast
Comparison: None.

CLINICAL DATA: Elevated liver function tests.

EXAM:
ULTRASOUND ABDOMEN LIMITED RIGHT UPPER QUADRANT

[Series 1: us abdomen limited ruq (liver/gb) · 14 of 32 slices shown]
[im 1/32]
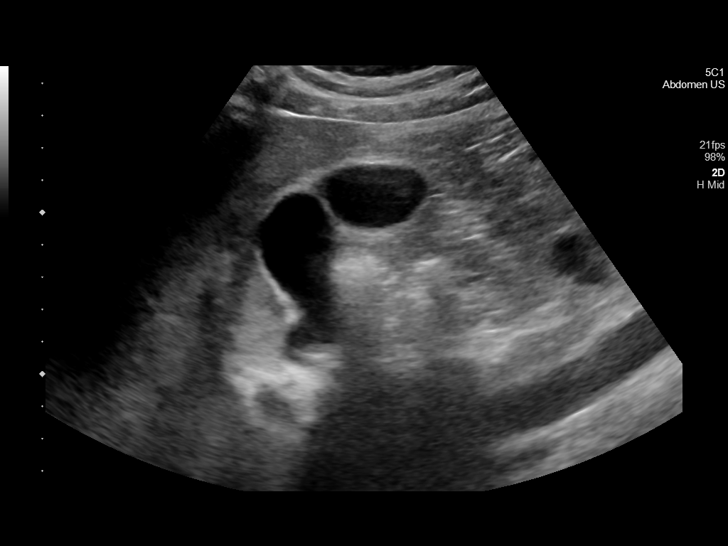
[im 3/32]
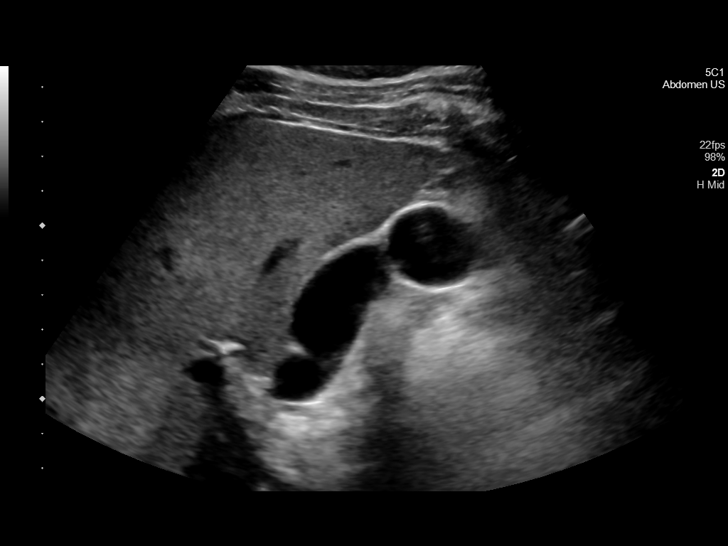
[im 6/32]
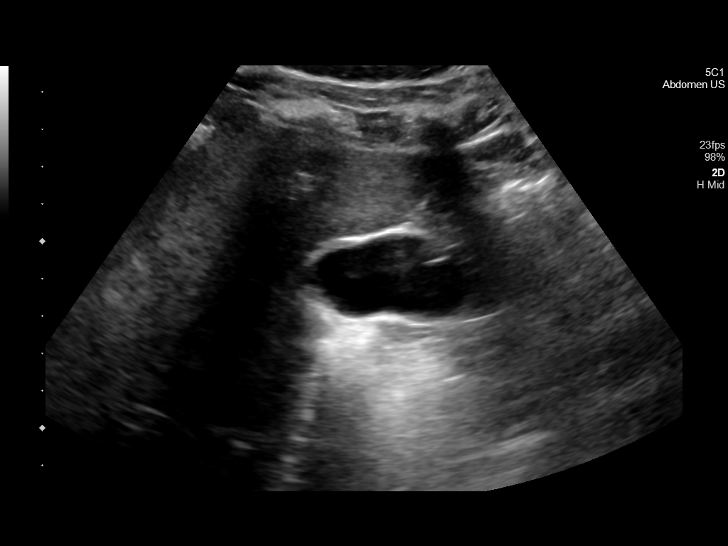
[im 8/32]
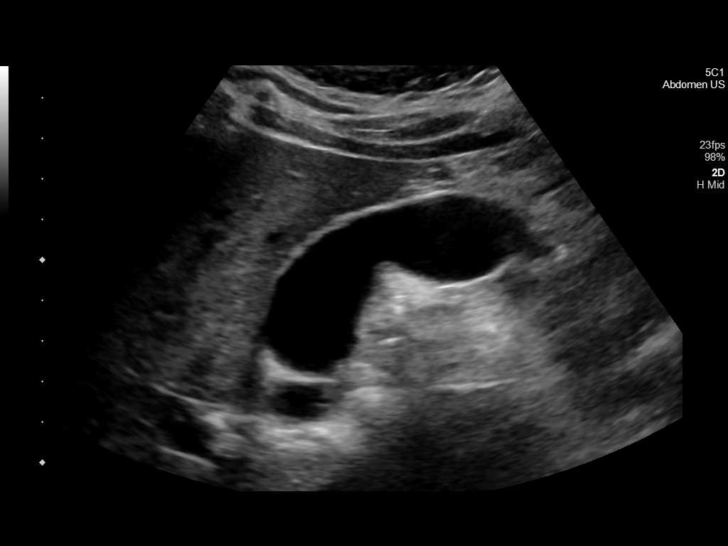
[im 11/32]
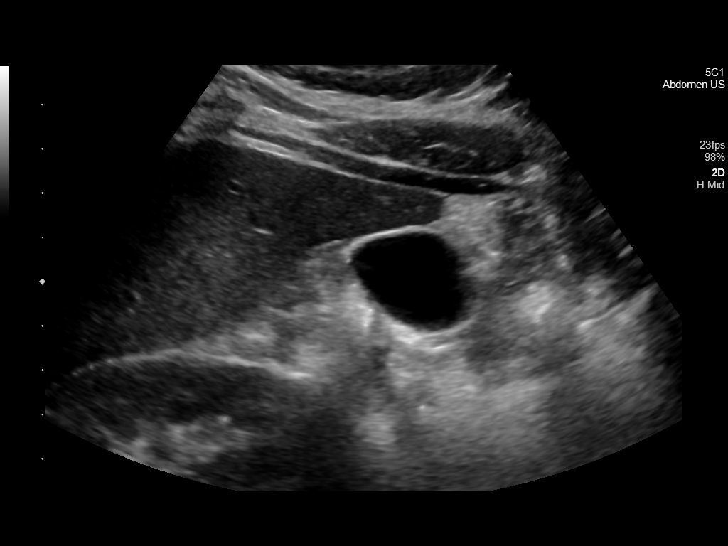
[im 12/32]
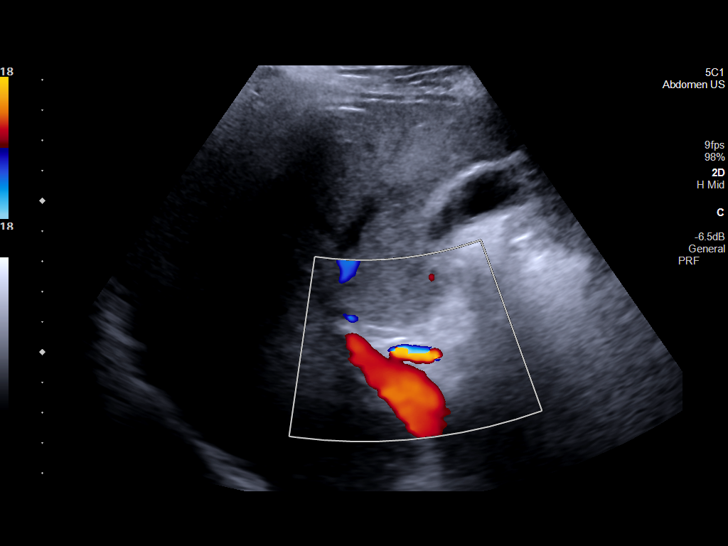
[im 15/32]
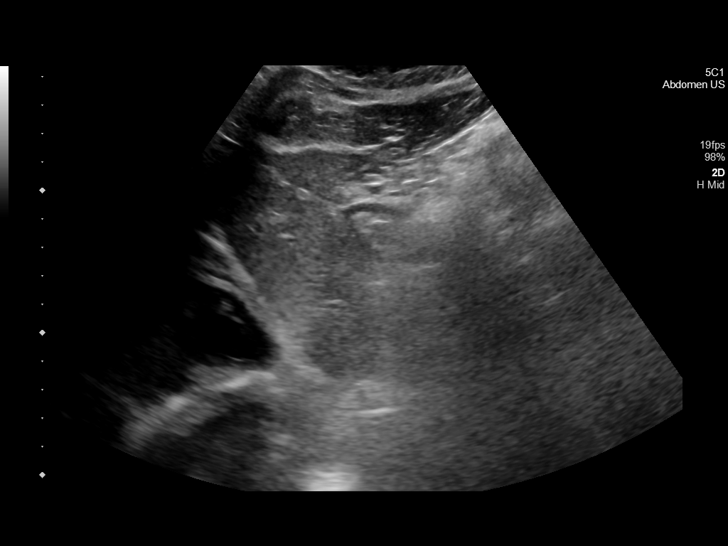
[im 17/32]
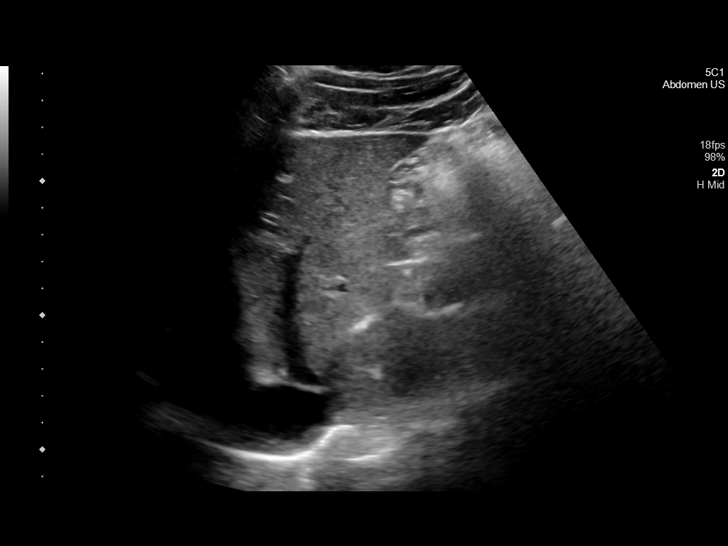
[im 20/32]
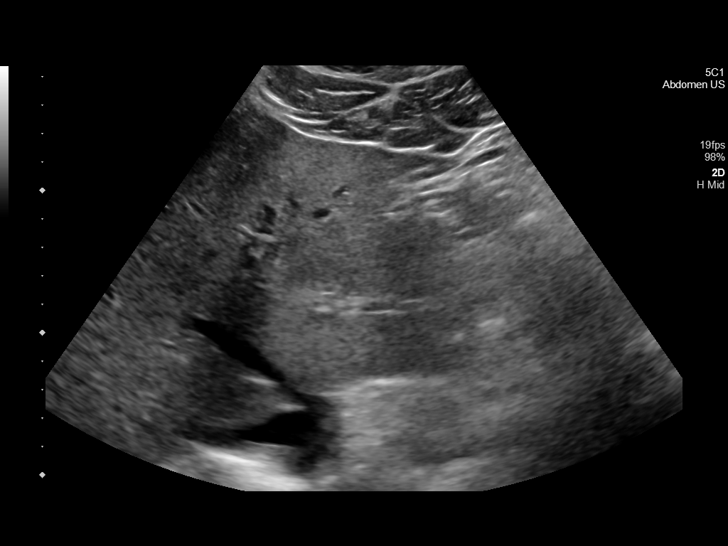
[im 21/32]
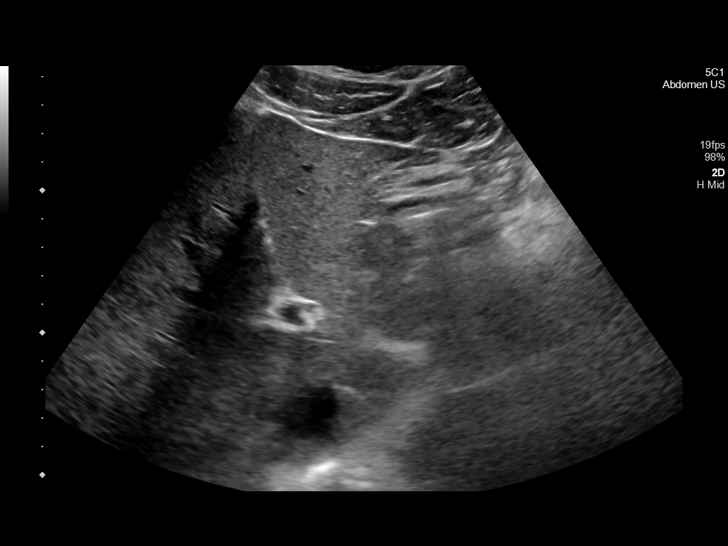
[im 24/32]
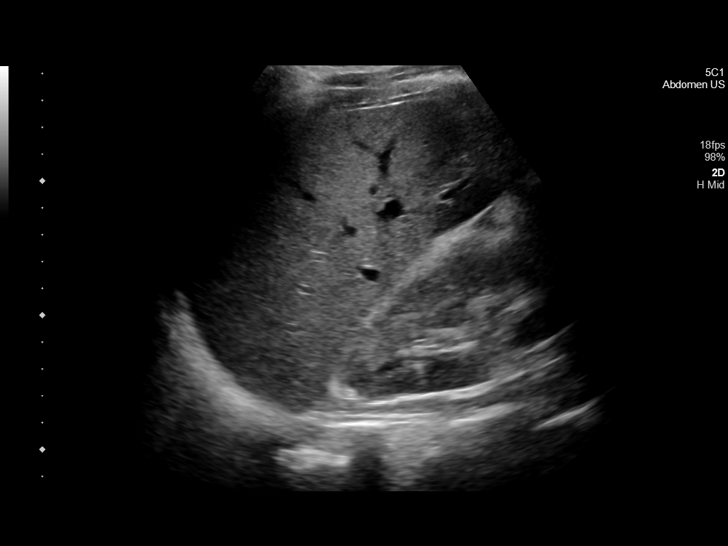
[im 26/32]
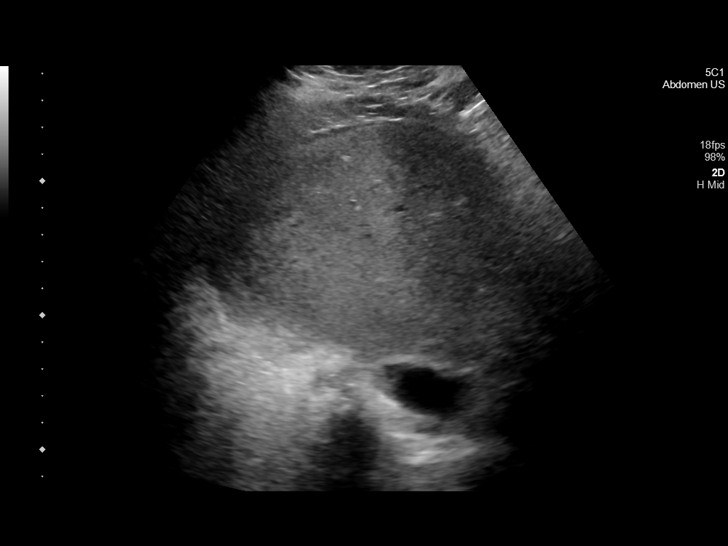
[im 29/32]
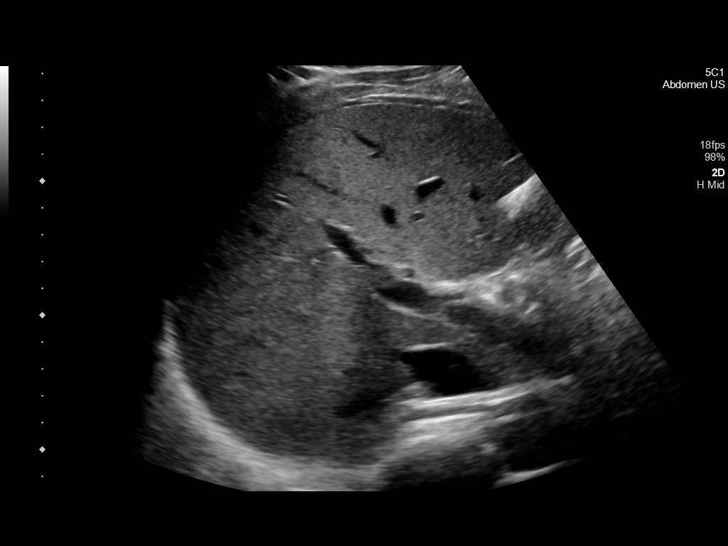
[im 32/32]
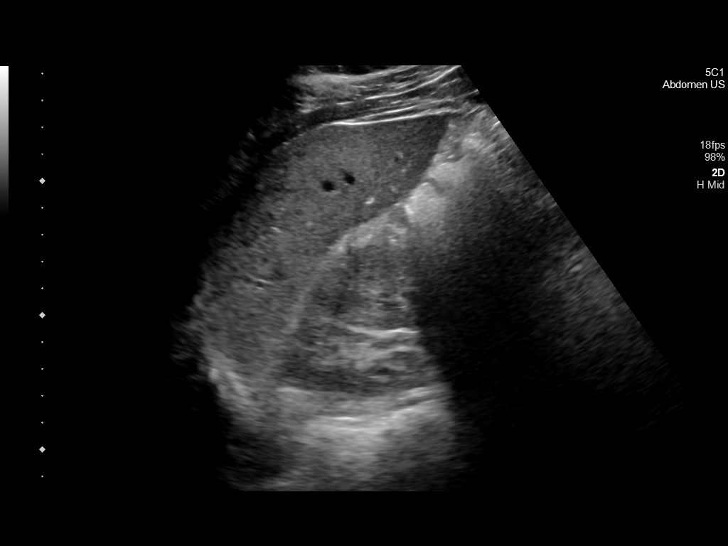

[14 of 25 positions shown; findings below may reference images not displayed]

FINDINGS: Gallbladder:

No gallstones or wall thickening visualized. No sonographic Murphy
sign noted by sonographer.

Common bile duct:

Diameter: 3 mm which is within normal limits.

Liver:

No focal lesion identified. Increased echogenicity of hepatic
parenchyma is noted suggesting hepatic steatosis or other diffuse
hepatocellular disease. Portal vein is patent on color Doppler
imaging with normal direction of blood flow towards the liver.

Other: None.
IMPRESSION: Increased echogenicity of hepatic parenchyma is noted suggesting
hepatic steatosis or other diffuse hepatocellular disease. No other
abnormality is noted in the right upper quadrant of the abdomen.
